# Patient Record
Sex: Male | Born: 1965 | Race: Black or African American | Hispanic: No | Marital: Single | Smoking: Never smoker
Health system: Southern US, Community
[De-identification: ages and names within clinical notes are randomized; demographics above are authoritative.]

## PROBLEM LIST (undated history)

## (undated) ENCOUNTER — Emergency Department: Payer: BC Managed Care – PPO

## (undated) DIAGNOSIS — E119 Type 2 diabetes mellitus without complications: Secondary | ICD-10-CM

## (undated) DIAGNOSIS — M5416 Radiculopathy, lumbar region: Secondary | ICD-10-CM

## (undated) DIAGNOSIS — J189 Pneumonia, unspecified organism: Secondary | ICD-10-CM

## (undated) DIAGNOSIS — I10 Essential (primary) hypertension: Secondary | ICD-10-CM

## (undated) HISTORY — PX: KNEE SURGERY: SHX244

---

## 2004-06-08 ENCOUNTER — Ambulatory Visit: Payer: Self-pay | Admitting: Internal Medicine

## 2004-06-14 ENCOUNTER — Ambulatory Visit: Payer: Self-pay | Admitting: Internal Medicine

## 2009-02-09 ENCOUNTER — Emergency Department: Payer: Self-pay | Admitting: Emergency Medicine

## 2016-10-14 ENCOUNTER — Other Ambulatory Visit: Payer: Self-pay | Admitting: Oncology

## 2017-11-11 ENCOUNTER — Encounter: Payer: Self-pay | Admitting: Emergency Medicine

## 2017-11-11 ENCOUNTER — Other Ambulatory Visit: Payer: Self-pay

## 2017-11-11 ENCOUNTER — Ambulatory Visit
Admission: EM | Admit: 2017-11-11 | Discharge: 2017-11-11 | Disposition: A | Discharge status: Home / Self Care | Payer: 59 | Attending: Family Medicine | Admitting: Family Medicine

## 2017-11-11 DIAGNOSIS — J069 Acute upper respiratory infection, unspecified: Secondary | ICD-10-CM

## 2017-11-11 DIAGNOSIS — B9789 Other viral agents as the cause of diseases classified elsewhere: Secondary | ICD-10-CM | POA: Diagnosis not present

## 2017-11-11 NOTE — ED Triage Notes (Signed)
Patient c/o sinus congestion and pressure, cough, and sneezing that started Saturday night.  Patient denies fevers.

## 2017-11-11 NOTE — ED Provider Notes (Signed)
[facility]    CSN: [id number] Arrival date & time: 11/11/17  1010     History   Chief Complaint Chief Complaint  Patient presents with  . Nasal Congestion  . Sinus Problem    HPI Alexander Deleon is a 52 y.o. male.   The history is provided by the patient.  Sinus Problem  Pertinent negatives include no headaches.  URI  Presenting symptoms: congestion and rhinorrhea   Presenting symptoms comment:  "scratchy throat" Severity:  Moderate Onset quality:  Sudden Duration:  1 day Timing:  Constant Progression:  Unchanged Chronicity:  New Relieved by:  None tried Ineffective treatments:  None tried Associated symptoms: no headaches, no myalgias, no sinus pain and no wheezing   Risk factors: sick contacts   Risk factors: not elderly, no chronic cardiac disease, no chronic kidney disease, no chronic respiratory disease, no diabetes mellitus, no immunosuppression, no recent illness and no recent travel     History reviewed. No pertinent past medical history.  There are no active problems to display for this patient.   Past Surgical History:  Procedure Laterality Date  . KNEE SURGERY Left        Home Medications    Prior to Admission medications   Not on File    Family History Family History  Problem Relation Age of Onset  . Hypertension Mother   . Hypertension Father     Social History Social History   Tobacco Use  . Smoking status: Never Smoker  . Smokeless tobacco: Never Used  Substance Use Topics  . Alcohol use: Yes  . Drug use: Never     Allergies   Patient has no known allergies.   Review of Systems Review of Systems  HENT: Positive for congestion and rhinorrhea. Negative for sinus pain.   Respiratory: Negative for wheezing.   Musculoskeletal: Negative for myalgias.  Neurological: Negative for headaches.     Physical Exam Triage Vital Signs ED Triage Vitals  Enc Vitals Group     BP 11/11/17 1035 (!) 133/97     Pulse  Rate 11/11/17 1035 85     Resp 11/11/17 1035 16     Temp 11/11/17 1035 98.7 F (37.1 C)     Temp Source 11/11/17 1035 Oral     SpO2 11/11/17 1035 99 %     Weight 11/11/17 1033 218 lb (98.9 kg)     Height 11/11/17 1033 6\' 1"  (1.854 m)     Head Circumference --      Peak Flow --      Pain Score 11/11/17 1033 0     Pain Loc --      Pain Edu? --      Excl. in GC? --    No data found.  Updated Vital Signs BP (!) 133/97 (BP Location: Left Arm)   Pulse 85   Temp 98.7 F (37.1 C) (Oral)   Resp 16   Ht 6\' 1"  (1.854 m)   Wt 98.9 kg   SpO2 99%   BMI 28.76 kg/m   Visual Acuity Right Eye Distance:   Left Eye Distance:   Bilateral Distance:    Right Eye Near:   Left Eye Near:    Bilateral Near:     Physical Exam  Constitutional: He appears well-developed and well-nourished. No distress.  HENT:  Head: Normocephalic and atraumatic.  Right Ear: Tympanic membrane, external ear and ear canal normal.  Left Ear: Tympanic membrane, external ear and ear canal normal.  Nose: Rhinorrhea present.  Mouth/Throat: Uvula is midline and mucous membranes are normal. Posterior oropharyngeal erythema present. No oropharyngeal exudate, posterior oropharyngeal edema or tonsillar abscesses. No tonsillar exudate.  Neck: No tracheal deviation present. No thyromegaly present.  Cardiovascular: Normal rate, regular rhythm and normal heart sounds.  Pulmonary/Chest: Effort normal and breath sounds normal. No stridor. No respiratory distress. He has no wheezes. He has no rales. He exhibits no tenderness.  Lymphadenopathy:    He has no cervical adenopathy.  Neurological: He is alert.  Skin: Skin is warm and dry. No rash noted. He is not diaphoretic.  Nursing note and vitals reviewed.    UC Treatments / Results  Labs (all labs ordered are listed, but only abnormal results are displayed) Labs Reviewed - No data to display  EKG None  Radiology No results found.  Procedures Procedures (including  critical care time)  Medications Ordered in UC Medications - No data to display  Initial Impression / Assessment and Plan / UC Course  I have reviewed the triage vital signs and the nursing notes.  Pertinent labs & imaging results that were available during my care of the patient were reviewed by me and considered in my medical decision making (see chart for details).      Final Clinical Impressions(s) / UC Diagnoses   Final diagnoses:  Viral URI with cough   Discharge Instructions   None    ED Prescriptions    None     1. diagnosis reviewed with patient 2. Recommend supportive treatment with rest, fluids, otc "cold" medication (note: patient appeared upset that I didn't prescribe anything, stating "I've been waiting her and your not going to give me anything", then walked out of room before conversation ended) 3. Follow-up prn if symptoms worsen or don't improve  Controlled Substance Prescriptions  Controlled Substance Registry consulted? Not Applicable   Payton Mccallum, MD 11/11/17 1128

## 2018-11-10 ENCOUNTER — Other Ambulatory Visit: Payer: Self-pay

## 2018-11-10 DIAGNOSIS — Z20822 Contact with and (suspected) exposure to covid-19: Secondary | ICD-10-CM

## 2018-11-11 ENCOUNTER — Telehealth: Payer: Self-pay | Admitting: General Practice

## 2018-11-11 LAB — NOVEL CORONAVIRUS, NAA: SARS-CoV-2, NAA: NOT DETECTED

## 2018-11-11 NOTE — Telephone Encounter (Signed)
Patient call and received his covid test results

## 2018-12-10 ENCOUNTER — Encounter: Payer: Self-pay | Admitting: Family Medicine

## 2019-01-12 ENCOUNTER — Other Ambulatory Visit: Payer: Self-pay

## 2019-01-12 DIAGNOSIS — Z20822 Contact with and (suspected) exposure to covid-19: Secondary | ICD-10-CM

## 2019-01-13 LAB — NOVEL CORONAVIRUS, NAA: SARS-CoV-2, NAA: NOT DETECTED

## 2019-01-19 ENCOUNTER — Ambulatory Visit (INDEPENDENT_AMBULATORY_CARE_PROVIDER_SITE_OTHER): Payer: 59 | Admitting: Gastroenterology

## 2019-01-19 ENCOUNTER — Other Ambulatory Visit: Payer: Self-pay

## 2019-01-19 VITALS — BP 142/88 | HR 89 | Temp 98.2°F | Ht 73.0 in | Wt 225.4 lb

## 2019-01-19 DIAGNOSIS — R159 Full incontinence of feces: Secondary | ICD-10-CM

## 2019-01-19 DIAGNOSIS — K5900 Constipation, unspecified: Secondary | ICD-10-CM | POA: Diagnosis not present

## 2019-01-19 NOTE — Progress Notes (Signed)
Alexander Mood MD, MRCP(U.K) [address]  [address]  [place], [place] [address]  Main: [phone]  Fax: [phone]   Gastroenterology Consultation  Referring Provider:     Preston Deleon* Primary Care Physician:  Alexander Fleeting, MD Primary Gastroenterologist:  Dr. Wyline Deleon  Reason for Consultation:     Fecal incontinence        HPI:   Alexander Deleon is a 53 y.o. y/o male referred for consultation & management  by Dr. Neita Deleon, Alexander Raddle, MD.   He states that since 2016 he has noticed that he has had fecal incontinence.  His undergarments are stained with a brown liquid.  He has a bowel movement every day but sometimes hard and sometimes very watery.  His diet is devoid of fiber.  Very rich in meat.  He had a colonoscopy in 2016 at [facility] which showed no polyps.  Repeat of suggested in 10 years.  No change in weight.  Some lower abdominal discomfort that improves after bowel movement.  Does not take any laxatives.   No past medical history on file.  Past Surgical History:  Procedure Laterality Date  . KNEE SURGERY Left     Prior to Admission medications   Not on File    Family History  Problem Relation Age of Onset  . Hypertension Mother   . Hypertension Father      Social History   Tobacco Use  . Smoking status: Never Smoker  . Smokeless tobacco: Never Used  Substance Use Topics  . Alcohol use: Yes  . Drug use: Never    Allergies as of 01/19/2019  . (No Known Allergies)    Review of Systems:    All systems reviewed and negative except where noted in HPI.   Physical Exam:  BP (!) 142/88   Pulse 89   Temp 98.2 F (36.8 C)   Ht 6\' 1"  (1.854 m)   Wt 225 lb 6.4 oz (102.2 kg)   BMI 29.74 kg/m  No LMP for male patient. Psych:  Alert and cooperative. Normal Deleon and affect. General:   Alert,  Well-developed, well-nourished, pleasant and cooperative in NAD Head:  Normocephalic and atraumatic. Eyes:  Sclera clear, no icterus.    Conjunctiva pink. Ears:  Normal auditory acuity. Nose:  No deformity, discharge, or lesions. Mouth:  No deformity or lesions,oropharynx pink & moist. Neck:  Supple; no masses or thyromegaly. Lungs:  Respirations even and unlabored.  Clear throughout to auscultation.   No wheezes, crackles, or rhonchi. No acute distress. Heart:  Regular rate and rhythm; no murmurs, clicks, rubs, or gallops. Abdomen:  Normal bowel sounds.  No bruits.  Soft, non-tender and non-distended without masses, hepatosplenomegaly or hernias noted.  No guarding or rebound tenderness.    Neurologic:  Alert and oriented x3;  grossly normal neurologically. Skin:  Intact without significant lesions or rashes. No jaundice. Lymph Nodes:  No significant cervical adenopathy. Psych:  Alert and cooperative. Normal Deleon and affect.  Imaging Studies: No results found.  Assessment and Plan:   Alexander Deleon is a 52 y.o. y/o male has been referred for fecal incontinence.  I suspect he may suffer from a degree of constipation leading to rectal impaction and possible overflow of liquid causing incontinence.  Other differentials would also include fistula from Crohn's disease and lesions in the lower GI tract.  His diet is very poor in fiber.  Plan 1.  High-fiber diet target of 25 to 30 g/day.  Provided patient information on the same.  Provided fiber pills samples.  If does not not work we will add MiraLAX or [place] 2.  I would suggest a colonoscopy to rule out fistula, lesions in the lower GI tract.  I will performed a detailed rectal exam on that day when he is more relaxed to rule out any sinus tracts or fistula a in the perianal area  I have discussed alternative options, risks & benefits,  which include, but are not limited to, bleeding, infection, perforation,respiratory complication & drug reaction.  The patient agrees with this plan & written consent will be obtained.     Follow up in 2 weeks telephone visit  Dr [name]  MD,MRCP(U.K)

## 2019-01-19 NOTE — Patient Instructions (Signed)

## 2019-01-23 ENCOUNTER — Other Ambulatory Visit: Payer: 59 | Attending: Gastroenterology

## 2019-01-23 ENCOUNTER — Telehealth: Payer: Self-pay

## 2019-01-23 NOTE — Telephone Encounter (Signed)
Called pt to remind him to go for his pre-procedure COVID test today. Pt states he wants to cancel his colonoscopy for now, he plans to contact our office when he's ready to reschedule. [facility] has been notified.

## 2019-01-28 ENCOUNTER — Encounter: Admission: RE | Payer: Self-pay | Source: Home / Self Care

## 2019-01-28 ENCOUNTER — Ambulatory Visit: Admission: RE | Admit: 2019-01-28 | Payer: 59 | Source: Home / Self Care | Admitting: Gastroenterology

## 2019-01-28 SURGERY — COLONOSCOPY WITH PROPOFOL
Anesthesia: General

## 2019-02-09 ENCOUNTER — Ambulatory Visit (INDEPENDENT_AMBULATORY_CARE_PROVIDER_SITE_OTHER): Payer: 59 | Admitting: Gastroenterology

## 2019-02-09 ENCOUNTER — Other Ambulatory Visit: Payer: Self-pay

## 2019-02-09 DIAGNOSIS — R159 Full incontinence of feces: Secondary | ICD-10-CM

## 2019-02-09 NOTE — Progress Notes (Signed)
    [name] , MD [address]  [place]  [place], [place] [address]  Main: [phone]  Fax: [phone]   Primary Care Physician: Theotis Burrow, MD  Virtual Visit via Telephone Note  I connected with patient on 02/09/19 at  1:30 PM EST by telephone and verified that I am speaking with the correct person using two identifiers.   I discussed the limitations, risks, security and privacy concerns of performing an evaluation and management service by telephone and the availability of in person appointments. I also discussed with the patient that there may be a patient responsible charge related to this service. The patient expressed understanding and agreed to proceed.  Location of Patient: Home Location of Provider: Home Persons involved: Patient and provider only   History of Present Illness:   Fecal incontinence follow-up  HPI: Alexander Deleon is a 53 y.o. male     Summary of history :  H/o fecal incontinence since 2016. His undergarments are stained with a brown liquid.  He has a bowel movement every day but sometimes hard and sometimes very watery.  His diet is devoid of fiber.  Very rich in meat.  He had a colonoscopy in 2016 at [facility] which showed no polyps.  Repeat of suggested in 10 years.  No change in weight.  Some lower abdominal discomfort that improves after bowel movement.  Does not take any laxatives.  Interval history   01/19/2019-02/09/2019  Says the issues with incontinence at the same but he does not want to schedule colonoscopy at this point of time.  I explained the importance of doing so.    No current outpatient medications on file.   No current facility-administered medications for this visit.     Allergies as of 02/09/2019  . (No Known Allergies)    Review of Systems:    All systems reviewed and negative except where noted in HPI.   Observations/Objective:  Labs: CMP  No results found for: NA, K, CL, CO2, GLUCOSE, BUN,  CREATININE, CALCIUM, PROT, ALBUMIN, AST, ALT, ALKPHOS, BILITOT, GFRNONAA, GFRAA No results found for: WBC, HGB, HCT, MCV, PLT  Imaging Studies: No results found.  Assessment and Plan:   Alexander Deleon is a 53 y.o. y/o male here to follow up for fecal incontinence.  I suspect he may suffer from a degree of constipation leading to rectal impaction and possible overflow of liquid causing incontinence.  Other differentials would also include fistula from Crohn's disease and lesions in the lower GI tract.    Since his last visit no significant increase in the fiber content of his food.  He canceled his colonoscopy that was scheduled.  He said he will call and schedule it when he decides to do so.  I said we will follow-up with him after his colonoscopy as to what the next steps would be for stage fecal incontinence.   I discussed the assessment and treatment plan with the patient. The patient was provided an opportunity to ask questions and all were answered. The patient agreed with the plan and demonstrated an understanding of the instructions.   The patient was advised to call back or seek an in-person evaluation if the symptoms worsen or if the condition fails to improve as anticipated.  I provided 8 minutes of non-face-to-face time during this encounter.  Dr [name] MD,MRCP [facility]) Gastroenterology/Hepatology Pager: [phone]   Speech recognition software was used to dictate this note.

## 2019-04-02 ENCOUNTER — Other Ambulatory Visit: Payer: Self-pay

## 2019-04-02 ENCOUNTER — Encounter: Payer: Self-pay | Admitting: Family Medicine

## 2019-04-02 ENCOUNTER — Ambulatory Visit: Payer: BC Managed Care – PPO | Admitting: Family Medicine

## 2019-04-02 DIAGNOSIS — Z113 Encounter for screening for infections with a predominantly sexual mode of transmission: Secondary | ICD-10-CM

## 2019-04-02 LAB — GRAM STAIN

## 2019-04-02 NOTE — Progress Notes (Signed)
In for STD screening; denies s/s; declines HIV/RPR testing Sharlette Dense, RN

## 2019-04-02 NOTE — Progress Notes (Signed)
  [facility] Department STI clinic/screening visit  Subjective:  Alexander Deleon is a 54 y.o. male being seen today for  Chief Complaint  Patient presents with  . SEXUALLY TRANSMITTED DISEASE     The patient reports they no have symptoms.   Patient has the following medical conditions:  There are no problems to display for this patient.   HPI  Pt reports he is here for STI testing. Denies symptoms.   See flowsheet for further details and programmatic requirements.    No components found for: HCV  The following portions of the patient's history were reviewed and updated as appropriate: allergies, current medications, past medical history, past social history, past surgical history and problem list.  Objective:  There were no vitals filed for this visit.   Physical Exam Constitutional:      Appearance: Normal appearance.  HENT:     Head: Normocephalic and atraumatic.     Comments: No nits or hair loss    Mouth/Throat:     Mouth: Mucous membranes are moist.     Pharynx: Oropharynx is clear. No oropharyngeal exudate or posterior oropharyngeal erythema.  Pulmonary:     Effort: Pulmonary effort is normal.  Abdominal:     General: Abdomen is flat.     Palpations: Abdomen is soft. There is no hepatomegaly or mass.     Tenderness: There is no abdominal tenderness.  Genitourinary:    Pubic Area: No rash or pubic lice.      Penis: Normal and circumcised.      Testes: Normal.     Epididymis:     Right: Normal.     Left: Normal.     Rectum: Normal.  Lymphadenopathy:     Head:     Right side of head: No preauricular or posterior auricular adenopathy.     Left side of head: No preauricular or posterior auricular adenopathy.     Cervical: No cervical adenopathy.     Upper Body:     Right upper body: No supraclavicular or axillary adenopathy.     Left upper body: No supraclavicular or axillary adenopathy.     Lower Body: No right inguinal adenopathy. No left  inguinal adenopathy.  Skin:    General: Skin is warm and dry.     Findings: No rash.  Neurological:     Mental Status: He is alert and oriented to person, place, and time.       Assessment and Plan:  Alexander Deleon is a 54 y.o. male presenting to the [facility] Department for STI screening    1. Screening examination for venereal disease -Screenings today as below. Treat gram stain per standing order. -Patient does meet criteria for HepC Screening, not Hep B. Declines this as well as HIV and syphilis screenings. -Counseled on warning s/sx and when to seek care. Recommended condom use with all sex and discussed importance of condom use for STI prevention. - Gram stain - Gonococcus culture   Return for screening as needed.  No future appointments.  Ann Held, PA-C

## 2019-04-07 LAB — GONOCOCCUS CULTURE

## 2019-04-14 ENCOUNTER — Other Ambulatory Visit: Payer: Self-pay

## 2019-04-14 DIAGNOSIS — R159 Full incontinence of feces: Secondary | ICD-10-CM

## 2019-04-30 ENCOUNTER — Other Ambulatory Visit: Payer: Self-pay

## 2019-04-30 ENCOUNTER — Other Ambulatory Visit
Admission: RE | Admit: 2019-04-30 | Discharge: 2019-04-30 | Disposition: A | Discharge status: Home / Self Care | Payer: BC Managed Care – PPO | Source: Ambulatory Visit | Attending: Gastroenterology | Admitting: Gastroenterology

## 2019-04-30 ENCOUNTER — Telehealth: Payer: Self-pay

## 2019-04-30 DIAGNOSIS — Z01812 Encounter for preprocedural laboratory examination: Secondary | ICD-10-CM | POA: Insufficient documentation

## 2019-04-30 DIAGNOSIS — Z20822 Contact with and (suspected) exposure to covid-19: Secondary | ICD-10-CM | POA: Insufficient documentation

## 2019-04-30 LAB — SARS CORONAVIRUS 2 (TAT 6-24 HRS): SARS Coronavirus 2: NEGATIVE

## 2019-04-30 NOTE — Telephone Encounter (Signed)
Patient has been informed his bowel prep has been changed from SuPrep to Greer.  Insurance does not cover [employer]. Patient has been instructed to begin Golytely Bowel prep the evening before his colonoscopy at 5pm.  Drink 8 oz every 30 minutes until he completes the entire contents.  Thanks,  [place], [place]

## 2019-05-01 ENCOUNTER — Encounter: Payer: Self-pay | Admitting: Gastroenterology

## 2019-05-04 ENCOUNTER — Ambulatory Visit: Payer: BC Managed Care – PPO | Admitting: Anesthesiology

## 2019-05-04 ENCOUNTER — Encounter
Admission: RE | Disposition: A | Discharge status: Home / Self Care | Payer: Self-pay | Source: Home / Self Care | Attending: Gastroenterology

## 2019-05-04 ENCOUNTER — Ambulatory Visit
Admission: RE | Admit: 2019-05-04 | Discharge: 2019-05-04 | Disposition: A | Discharge status: Home / Self Care | Payer: BC Managed Care – PPO | Attending: Gastroenterology | Admitting: Gastroenterology

## 2019-05-04 ENCOUNTER — Encounter: Payer: Self-pay | Admitting: Gastroenterology

## 2019-05-04 DIAGNOSIS — Z8249 Family history of ischemic heart disease and other diseases of the circulatory system: Secondary | ICD-10-CM | POA: Diagnosis not present

## 2019-05-04 DIAGNOSIS — K640 First degree hemorrhoids: Secondary | ICD-10-CM | POA: Diagnosis not present

## 2019-05-04 DIAGNOSIS — R159 Full incontinence of feces: Secondary | ICD-10-CM | POA: Diagnosis present

## 2019-05-04 HISTORY — PX: COLONOSCOPY WITH PROPOFOL: SHX5780

## 2019-05-04 SURGERY — COLONOSCOPY WITH PROPOFOL
Anesthesia: General

## 2019-05-04 MED ORDER — PROPOFOL 500 MG/50ML IV EMUL
INTRAVENOUS | Status: DC | PRN
  Administered 2019-05-04: 140 ug/kg/min via INTRAVENOUS

## 2019-05-04 MED ORDER — SODIUM CHLORIDE 0.9 % IV SOLN: INTRAVENOUS | Status: DC

## 2019-05-04 MED ORDER — PROPOFOL 10 MG/ML IV BOLUS
INTRAVENOUS | Status: DC | PRN
  Administered 2019-05-04: 30 mg via INTRAVENOUS
  Administered 2019-05-04: 70 mg via INTRAVENOUS
  Administered 2019-05-04: 40 mg via INTRAVENOUS

## 2019-05-04 NOTE — Transfer of Care (Signed)
Immediate Anesthesia Transfer of Care Note  Patient: Alexander Deleon  Procedure(s) Performed: COLONOSCOPY WITH PROPOFOL (N/A )  Patient Location: PACU  Anesthesia Type:General  Level of Consciousness: sedated  Airway & Oxygen Therapy: Patient Spontanous Breathing and Patient connected to nasal cannula oxygen  Post-op Assessment: Report given to RN and Post -op Vital signs reviewed and stable  Post vital signs: Reviewed and stable  Last Vitals:  Vitals Value Taken Time  BP 120/85 05/04/19 1041  Temp    Pulse 69 05/04/19 1040  Resp 13 05/04/19 1041  SpO2 99 % 05/04/19 1040  Vitals shown include unvalidated device data.  Last Pain:  Vitals:   05/04/19 1010  TempSrc: Temporal  PainSc:          Complications: No apparent anesthesia complications

## 2019-05-04 NOTE — Op Note (Signed)
[facility] Gastroenterology Patient Name: Alexander Deleon Procedure Date: 05/04/2019 9:56 AM MRN: [id number] Account #: [id number] Date of Birth: 08-Nov-1965 Admit Type: Outpatient Age: 54 Room: [facility] ENDO ROOM 4 Gender: Male Note Status: Finalized Procedure:             Colonoscopy Indications:           Fecal incontinence Providers:             [name] MD, MD Referring MD:          Theotis Burrow (Referring MD) Medicines:             Monitored Anesthesia Care Complications:         No immediate complications. Procedure:             Pre-Anesthesia Assessment:                        - Prior to the procedure, a History and Physical was                         performed, and patient medications, allergies and                         sensitivities were reviewed. The patient's tolerance                         of previous anesthesia was reviewed.                        - The risks and benefits of the procedure and the                         sedation options and risks were discussed with the                         patient. All questions were answered and informed                         consent was obtained.                        - ASA Grade Assessment: II - A patient with mild                         systemic disease.                        After obtaining informed consent, the colonoscope was                         passed under direct vision. Throughout the procedure,                         the patient's blood pressure, pulse, and oxygen                         saturations were monitored continuously. The                         Colonoscope was introduced through the anus and  advanced to the the cecum, identified by the                         appendiceal orifice. The colonoscopy was performed                         with ease. The patient tolerated the procedure well.                         The quality of the bowel preparation  was good. Findings:      The perianal and digital rectal examinations were normal.      Non-bleeding internal hemorrhoids were found during retroflexion. The       hemorrhoids were large and Grade I (internal hemorrhoids that do not       prolapse).      The exam was otherwise without abnormality on direct and retroflexion       views. Impression:            - Non-bleeding internal hemorrhoids.                        - The examination was otherwise normal on direct and                         retroflexion views.                        - No specimens collected. Recommendation:        - Discharge patient to home (with escort).                        - Resume previous diet.                        - Continue present medications.                        - Repeat colonoscopy in 10 years for screening                         purposes.                        - Return to my office in 4 weeks. Procedure Code(s):     --- Professional ---                        [phone], Colonoscopy, flexible; diagnostic, including                         collection of specimen(s) by brushing or washing, when                         performed (separate procedure) Diagnosis Code(s):     --- Professional ---                        K64.0, First degree hemorrhoids                        R15.9, Full incontinence of feces CPT copyright 2019 American Medical Association. All  rights reserved. The codes documented in this report are preliminary and upon coder review may  be revised to meet current compliance requirements. Wyline Mood, MD Wyline Mood MD, MD 05/04/2019 10:13:40 AM This report has been signed electronically. Number of Addenda: 0 Note Initiated On: 05/04/2019 9:56 AM Scope Withdrawal Time: 0 hours 9 minutes 5 seconds  Total Procedure Duration: 0 hours 10 minutes 54 seconds  Estimated Blood Loss:  Estimated blood loss: none.      [facility]

## 2019-05-04 NOTE — Anesthesia Preprocedure Evaluation (Signed)
Anesthesia Evaluation  Patient identified by MRN, date of birth, ID band Patient awake    Reviewed: Allergy & Precautions, H&P , NPO status , Patient's Chart, lab work & pertinent test results, reviewed documented beta blocker date and time   Airway Mallampati: II   Neck ROM: full    Dental  (+) Poor Dentition   Pulmonary neg pulmonary ROS,    Pulmonary exam normal        Cardiovascular negative cardio ROS Normal cardiovascular exam Rhythm:regular Rate:Normal     Neuro/Psych negative neurological ROS  negative psych ROS   GI/Hepatic negative GI ROS, Neg liver ROS,   Endo/Other  negative endocrine ROS  Renal/GU negative Renal ROS  negative genitourinary   Musculoskeletal   Abdominal   Peds  Hematology negative hematology ROS (+)   Anesthesia Other Findings History reviewed. No pertinent past medical history. Past Surgical History: No date: KNEE SURGERY; Left BMI    Body Mass Index: 28.76 kg/m     Reproductive/Obstetrics negative OB ROS                             Anesthesia Physical Anesthesia Plan  ASA: II  Anesthesia Plan: General   Post-op Pain Management:    Induction:   PONV Risk Score and Plan:   Airway Management Planned:   Additional Equipment:   Intra-op Plan:   Post-operative Plan:   Informed Consent: I have reviewed the patients History and Physical, chart, labs and discussed the procedure including the risks, benefits and alternatives for the proposed anesthesia with the patient or authorized representative who has indicated his/her understanding and acceptance.     Dental Advisory Given  Plan Discussed with: CRNA  Anesthesia Plan Comments:         Anesthesia Quick Evaluation

## 2019-05-04 NOTE — H&P (Signed)
[name], MD [address] [place], [place], [place], [address] [address], [place], [place], [place], [address] Phone: [phone]  Fax: [phone]  Primary Care Physician:  Theotis Burrow, MD   Pre-Procedure History & Physical: HPI:  Alexander Deleon is a 54 y.o. male is here for an colonoscopy.   History reviewed. No pertinent past medical history.  Past Surgical History:  Procedure Laterality Date  . KNEE SURGERY Left     Prior to Admission medications   Not on File    Allergies as of 04/15/2019  . (No Known Allergies)    Family History  Problem Relation Age of Onset  . Hypertension Mother   . Hypertension Father     Social History   Socioeconomic History  . Marital status: Single    Spouse name: Not on file  . Number of children: Not on file  . Years of education: Not on file  . Highest education level: Not on file  Occupational History  . Not on file  Tobacco Use  . Smoking status: Never Smoker  . Smokeless tobacco: Never Used  Substance and Sexual Activity  . Alcohol use: Yes    Alcohol/week: 25.0 standard drinks    Types: 20 Cans of beer, 5 Shots of liquor per week  . Drug use: Never  . Sexual activity: Yes    Partners: Female  Other Topics Concern  . Not on file  Social History Narrative  . Not on file   Social Determinants of Health   Financial Resource Strain:   . Difficulty of Paying Living Expenses: Not on file  Food Insecurity:   . Worried About Charity fundraiser in the Last Year: Not on file  . Ran Out of Food in the Last Year: Not on file  Transportation Needs:   . Lack of Transportation (Medical): Not on file  . Lack of Transportation (Non-Medical): Not on file  Physical Activity:   . Days of Exercise per Week: Not on file  . Minutes of Exercise per Session: Not on file  Stress:   . Feeling of Stress : Not on file  Social Connections:   . Frequency of Communication with Friends and Family: Not on  file  . Frequency of Social Gatherings with Friends and Family: Not on file  . Attends Religious Services: Not on file  . Active Member of Clubs or Organizations: Not on file  . Attends Archivist Meetings: Not on file  . Marital Status: Not on file  Intimate Partner Violence:   . Fear of Current or Ex-Partner: Not on file  . Emotionally Abused: Not on file  . Physically Abused: Not on file  . Sexually Abused: Not on file    Review of Systems: See HPI, otherwise negative ROS  Physical Exam: BP 122/90   Pulse 76   Temp (!) 97.4 F (36.3 C) (Temporal)   Resp 16   Ht 6\' 1"  (1.854 m)   Wt 98.9 kg   SpO2 100%   BMI 28.76 kg/m  General:   Alert,  pleasant and cooperative in NAD Head:  Normocephalic and atraumatic. Neck:  Supple; no masses or thyromegaly. Lungs:  Clear throughout to auscultation, normal respiratory effort.    Heart:  +S1, +S2, Regular rate and rhythm, No edema. Abdomen:  Soft, nontender and nondistended. Normal bowel sounds, without guarding, and without rebound.   Neurologic:  Alert and  oriented x4;  grossly normal neurologically.  Impression/Plan: Alexander Deleon is here for an colonoscopy to be performed for fecal incontiennce. Risks, benefits, limitations, and alternatives regarding  colonoscopy have been reviewed with the patient.  Questions have been answered.  All parties agreeable.   Wyline Mood, MD  05/04/2019, 9:55 AM

## 2019-05-05 ENCOUNTER — Encounter: Payer: Self-pay | Admitting: *Deleted

## 2019-05-05 NOTE — Anesthesia Postprocedure Evaluation (Signed)
Anesthesia Post Note  Patient: Alexander Deleon  Procedure(s) Performed: COLONOSCOPY WITH PROPOFOL (N/A )  Patient location during evaluation: PACU Anesthesia Type: General Level of consciousness: awake and alert Pain management: pain level controlled Vital Signs Assessment: post-procedure vital signs reviewed and stable Respiratory status: spontaneous breathing, nonlabored ventilation, respiratory function stable and patient connected to nasal cannula oxygen Cardiovascular status: blood pressure returned to baseline and stable Postop Assessment: no apparent nausea or vomiting Anesthetic complications: no     Last Vitals:  Vitals:   05/04/19 1030 05/04/19 1040  BP: 114/68 120/85  Pulse: 73 73  Resp: 14 18  Temp:    SpO2: 99% 100%    Last Pain:  Vitals:   05/04/19 1010  TempSrc: Temporal  PainSc:                  Yevette Edwards

## 2019-07-22 ENCOUNTER — Emergency Department: Payer: BC Managed Care – PPO

## 2019-07-22 ENCOUNTER — Inpatient Hospital Stay
Admission: EM | Admit: 2019-07-22 | Discharge: 2019-07-27 | DRG: 177 | Disposition: A | Discharge status: Home / Self Care | Payer: BC Managed Care – PPO | Attending: Internal Medicine | Admitting: Internal Medicine

## 2019-07-22 ENCOUNTER — Other Ambulatory Visit: Payer: Self-pay

## 2019-07-22 ENCOUNTER — Encounter: Payer: Self-pay | Admitting: Internal Medicine

## 2019-07-22 DIAGNOSIS — Z8249 Family history of ischemic heart disease and other diseases of the circulatory system: Secondary | ICD-10-CM

## 2019-07-22 DIAGNOSIS — R197 Diarrhea, unspecified: Secondary | ICD-10-CM | POA: Diagnosis not present

## 2019-07-22 DIAGNOSIS — J1282 Pneumonia due to coronavirus disease 2019: Secondary | ICD-10-CM | POA: Diagnosis present

## 2019-07-22 DIAGNOSIS — E118 Type 2 diabetes mellitus with unspecified complications: Secondary | ICD-10-CM | POA: Diagnosis not present

## 2019-07-22 DIAGNOSIS — Z7289 Other problems related to lifestyle: Secondary | ICD-10-CM | POA: Diagnosis not present

## 2019-07-22 DIAGNOSIS — E1165 Type 2 diabetes mellitus with hyperglycemia: Secondary | ICD-10-CM | POA: Diagnosis not present

## 2019-07-22 DIAGNOSIS — Z789 Other specified health status: Secondary | ICD-10-CM

## 2019-07-22 DIAGNOSIS — J9601 Acute respiratory failure with hypoxia: Secondary | ICD-10-CM | POA: Diagnosis present

## 2019-07-22 DIAGNOSIS — U071 COVID-19: Secondary | ICD-10-CM | POA: Diagnosis present

## 2019-07-22 DIAGNOSIS — F1090 Alcohol use, unspecified, uncomplicated: Secondary | ICD-10-CM

## 2019-07-22 DIAGNOSIS — D72819 Decreased white blood cell count, unspecified: Secondary | ICD-10-CM | POA: Diagnosis present

## 2019-07-22 LAB — BASIC METABOLIC PANEL
Anion gap: 9 (ref 5–15)
BUN: 15 mg/dL (ref 6–20)
CO2: 23 mmol/L (ref 22–32)
Calcium: 9 mg/dL (ref 8.9–10.3)
Chloride: 99 mmol/L (ref 98–111)
Creatinine, Ser: 1.2 mg/dL (ref 0.61–1.24)
GFR calc Af Amer: >60 mL/min (ref >60)
GFR calc non Af Amer: >60 mL/min (ref >60)
Glucose, Bld: 190 mg/dL — ABNORMAL HIGH (ref 70–99)
Potassium: 3.7 mmol/L (ref 3.5–5.1)
Sodium: 131 mmol/L — ABNORMAL LOW (ref 135–145)

## 2019-07-22 LAB — CBC WITH DIFFERENTIAL/PLATELET
Abs Immature Granulocytes: 0.01 10*3/uL (ref 0.00–0.07)
Basophils Absolute: 0 10*3/uL (ref 0.0–0.1)
Basophils Relative: 0 %
Eosinophils Absolute: 0 10*3/uL (ref 0.0–0.5)
Eosinophils Relative: 1 %
HCT: 44.2 % (ref 39.0–52.0)
Hemoglobin: 14.6 g/dL (ref 13.0–17.0)
Immature Granulocytes: 0 %
Lymphocytes Relative: 22 %
Lymphs Abs: 1 10*3/uL (ref 0.7–4.0)
MCH: 27.5 pg (ref 26.0–34.0)
MCHC: 33 g/dL (ref 30.0–36.0)
MCV: 83.2 fL (ref 80.0–100.0)
Monocytes Absolute: 0.4 10*3/uL (ref 0.1–1.0)
Monocytes Relative: 8 %
Neutro Abs: 3.2 10*3/uL (ref 1.7–7.7)
Neutrophils Relative %: 69 %
Platelets: 199 10*3/uL (ref 150–400)
RBC: 5.31 MIL/uL (ref 4.22–5.81)
RDW: 13.6 % (ref 11.5–15.5)
WBC: 4.7 10*3/uL (ref 4.0–10.5)
nRBC: 0 % (ref 0.0–0.2)

## 2019-07-22 LAB — HIV ANTIBODY (ROUTINE TESTING W REFLEX): HIV Screen 4th Generation wRfx: NONREACTIVE

## 2019-07-22 LAB — FIBRIN DERIVATIVES D-DIMER (ARMC ONLY): Fibrin derivatives D-dimer (ARMC): 571.19 ng/mL (FEU) — ABNORMAL HIGH (ref 0.00–499.00)

## 2019-07-22 LAB — FIBRINOGEN: Fibrinogen: 680 mg/dL — ABNORMAL HIGH (ref 210–475)

## 2019-07-22 LAB — HEPATIC FUNCTION PANEL
ALT: 45 U/L — ABNORMAL HIGH (ref 0–44)
AST: 51 U/L — ABNORMAL HIGH (ref 15–41)
Albumin: 3.4 g/dL — ABNORMAL LOW (ref 3.5–5.0)
Alkaline Phosphatase: 50 U/L (ref 38–126)
Bilirubin, Direct: 0.2 mg/dL (ref 0.0–0.2)
Indirect Bilirubin: 0.5 mg/dL (ref 0.3–0.9)
Total Bilirubin: 0.7 mg/dL (ref 0.3–1.2)
Total Protein: 7.6 g/dL (ref 6.5–8.1)

## 2019-07-22 LAB — C-REACTIVE PROTEIN: CRP: 15 mg/dL — ABNORMAL HIGH (ref <1.0)

## 2019-07-22 LAB — PROCALCITONIN: Procalcitonin: 0.1 ng/mL

## 2019-07-22 LAB — TRIGLYCERIDES: Triglycerides: 163 mg/dL — ABNORMAL HIGH (ref <150)

## 2019-07-22 LAB — FERRITIN: Ferritin: 507 ng/mL — ABNORMAL HIGH (ref 24–336)

## 2019-07-22 LAB — LACTATE DEHYDROGENASE: LDH: 329 U/L — ABNORMAL HIGH (ref 98–192)

## 2019-07-22 LAB — TROPONIN I (HIGH SENSITIVITY): Troponin I (High Sensitivity): 5 ng/L (ref <18)

## 2019-07-22 LAB — HEPATITIS B SURFACE ANTIGEN: Hepatitis B Surface Ag: NONREACTIVE

## 2019-07-22 LAB — BRAIN NATRIURETIC PEPTIDE: B Natriuretic Peptide: 85 pg/mL (ref 0.0–100.0)

## 2019-07-22 MED ORDER — THIAMINE HCL 100 MG/ML IJ SOLN
100.0000 mg | Freq: Every day | INTRAMUSCULAR | Status: DC
  Filled 2019-07-22: qty 2

## 2019-07-22 MED ORDER — METHYLPREDNISOLONE SODIUM SUCC 125 MG IJ SOLR
60.0000 mg | Freq: Two times a day (BID) | INTRAMUSCULAR | Status: DC
  Administered 2019-07-22 – 2019-07-27 (×10): 60 mg via INTRAVENOUS
  Filled 2019-07-22 (×10): qty 2

## 2019-07-22 MED ORDER — IPRATROPIUM BROMIDE HFA 17 MCG/ACT IN AERS
2.0000 | INHALATION_SPRAY | RESPIRATORY_TRACT | Status: DC
  Administered 2019-07-22 – 2019-07-27 (×30): 2 via RESPIRATORY_TRACT
  Filled 2019-07-22: qty 12.9

## 2019-07-22 MED ORDER — LOPERAMIDE HCL 2 MG PO CAPS: 2.0000 mg | ORAL_CAPSULE | Freq: Every day | ORAL | Status: DC | PRN

## 2019-07-22 MED ORDER — FOLIC ACID 1 MG PO TABS
1.0000 mg | ORAL_TABLET | Freq: Every day | ORAL | Status: DC
  Administered 2019-07-22 – 2019-07-27 (×6): 1 mg via ORAL
  Filled 2019-07-22 (×6): qty 1

## 2019-07-22 MED ORDER — ALBUTEROL SULFATE HFA 108 (90 BASE) MCG/ACT IN AERS
2.0000 | INHALATION_SPRAY | RESPIRATORY_TRACT | Status: DC | PRN
  Administered 2019-07-23: 20:00:00 2 via RESPIRATORY_TRACT
  Filled 2019-07-22: qty 6.7

## 2019-07-22 MED ORDER — SODIUM CHLORIDE 0.9 % IV SOLN
200.0000 mg | Freq: Once | INTRAVENOUS | Status: AC
  Administered 2019-07-22: 200 mg via INTRAVENOUS
  Filled 2019-07-22: qty 40

## 2019-07-22 MED ORDER — DM-GUAIFENESIN ER 30-600 MG PO TB12
1.0000 | ORAL_TABLET | Freq: Two times a day (BID) | ORAL | Status: DC
  Administered 2019-07-22 – 2019-07-27 (×11): 1 via ORAL
  Filled 2019-07-22 (×11): qty 1

## 2019-07-22 MED ORDER — ZINC SULFATE 220 (50 ZN) MG PO CAPS
220.0000 mg | ORAL_CAPSULE | Freq: Every day | ORAL | Status: DC
  Administered 2019-07-22 – 2019-07-27 (×6): 220 mg via ORAL
  Filled 2019-07-22 (×6): qty 1

## 2019-07-22 MED ORDER — SODIUM CHLORIDE 0.9 % IV SOLN
100.0000 mg | Freq: Every day | INTRAVENOUS | Status: AC
  Administered 2019-07-23 – 2019-07-26 (×4): 100 mg via INTRAVENOUS
  Filled 2019-07-22 (×4): qty 100

## 2019-07-22 MED ORDER — ACETAMINOPHEN 500 MG PO TABS
1000.0000 mg | ORAL_TABLET | Freq: Once | ORAL | Status: AC
  Administered 2019-07-22: 09:00:00 1000 mg via ORAL
  Filled 2019-07-22: qty 2

## 2019-07-22 MED ORDER — ADULT MULTIVITAMIN W/MINERALS CH
1.0000 | ORAL_TABLET | Freq: Every day | ORAL | Status: DC
  Administered 2019-07-22 – 2019-07-27 (×6): 1 via ORAL
  Filled 2019-07-22 (×6): qty 1

## 2019-07-22 MED ORDER — ONDANSETRON HCL 4 MG/2ML IJ SOLN: 4.0000 mg | Freq: Three times a day (TID) | INTRAMUSCULAR | Status: DC | PRN

## 2019-07-22 MED ORDER — ASCORBIC ACID 500 MG PO TABS
500.0000 mg | ORAL_TABLET | Freq: Every day | ORAL | Status: DC
  Administered 2019-07-22 – 2019-07-27 (×6): 500 mg via ORAL
  Filled 2019-07-22 (×7): qty 1

## 2019-07-22 MED ORDER — LORAZEPAM 2 MG/ML IJ SOLN: 0.0000 mg | Freq: Four times a day (QID) | INTRAMUSCULAR | Status: AC

## 2019-07-22 MED ORDER — ONDANSETRON HCL 4 MG/2ML IJ SOLN
4.0000 mg | Freq: Once | INTRAMUSCULAR | Status: AC
  Administered 2019-07-22: 09:00:00 4 mg via INTRAVENOUS
  Filled 2019-07-22: qty 2

## 2019-07-22 MED ORDER — THIAMINE HCL 100 MG PO TABS
100.0000 mg | ORAL_TABLET | Freq: Every day | ORAL | Status: DC
  Administered 2019-07-22 – 2019-07-27 (×6): 100 mg via ORAL
  Filled 2019-07-22 (×6): qty 1

## 2019-07-22 MED ORDER — TOCILIZUMAB 400 MG/20ML IV SOLN
8.0000 mg/kg | Freq: Once | INTRAVENOUS | Status: AC
  Administered 2019-07-22: 798 mg via INTRAVENOUS
  Filled 2019-07-22: qty 39.9

## 2019-07-22 MED ORDER — LORAZEPAM 2 MG/ML IJ SOLN: 1.0000 mg | INTRAMUSCULAR | Status: AC | PRN

## 2019-07-22 MED ORDER — ACETAMINOPHEN 325 MG PO TABS: 650.0000 mg | ORAL_TABLET | Freq: Four times a day (QID) | ORAL | Status: DC | PRN

## 2019-07-22 MED ORDER — ENOXAPARIN SODIUM 40 MG/0.4ML ~~LOC~~ SOLN
40.0000 mg | SUBCUTANEOUS | Status: DC
  Administered 2019-07-22 – 2019-07-26 (×5): 40 mg via SUBCUTANEOUS
  Filled 2019-07-22 (×5): qty 0.4

## 2019-07-22 MED ORDER — DEXAMETHASONE SODIUM PHOSPHATE 10 MG/ML IJ SOLN
10.0000 mg | Freq: Once | INTRAMUSCULAR | Status: AC
  Administered 2019-07-22: 10 mg via INTRAVENOUS
  Filled 2019-07-22: qty 1

## 2019-07-22 MED ORDER — LORAZEPAM 1 MG PO TABS: 1.0000 mg | ORAL_TABLET | ORAL | Status: AC | PRN

## 2019-07-22 MED ORDER — LACTATED RINGERS IV BOLUS
1000.0000 mL | Freq: Once | INTRAVENOUS | Status: AC
  Administered 2019-07-22: 1000 mL via INTRAVENOUS

## 2019-07-22 MED ORDER — LORAZEPAM 2 MG/ML IJ SOLN: 0.0000 mg | Freq: Two times a day (BID) | INTRAMUSCULAR | Status: AC

## 2019-07-22 MED ORDER — ALBUTEROL SULFATE HFA 108 (90 BASE) MCG/ACT IN AERS
2.0000 | INHALATION_SPRAY | Freq: Once | RESPIRATORY_TRACT | Status: AC
  Administered 2019-07-22: 2 via RESPIRATORY_TRACT
  Filled 2019-07-22: qty 6.7

## 2019-07-22 NOTE — ED Provider Notes (Signed)
[facility] Emergency Department Provider Note   ____________________________________________   First MD Initiated Contact with Patient 07/22/19 [phone]     (approximate)  I have reviewed the triage vital signs and the nursing notes.   HISTORY  Chief Complaint Shortness of breath   HPI Alexander Deleon is a 54 y.o. male with no significant past medical history who presents to the ED complaining of shortness of breath.  Patient reports that he initially started feeling malaised with cough and shortness of breath about 1 week ago.  He subsequently tested positive for [facility] at [facility] on [facility].  He states his breathing has continued to worsen since then and he has also been feeling nauseous with very poor appetite.  He states he has not had anything to eat for the past 3 days and endorses some diarrhea, but has not vomited.  He denies any fevers or pain in his chest, has not been taking any medications for this at home other than Tylenol.        No past medical history on file.  Patient Active Problem List   Diagnosis Date Noted  . Pneumonia due to COVID-19 virus 07/22/2019  . Acute respiratory failure with hypoxia ([place]) 07/22/2019  . Alcohol use 07/22/2019    Past Surgical History:  Procedure Laterality Date  . COLONOSCOPY WITH PROPOFOL N/A 05/04/2019   Procedure: COLONOSCOPY WITH PROPOFOL;  Surgeon: [name], MD;  Location: [facility] ENDOSCOPY;  Service: Gastroenterology;  Laterality: N/A;  . KNEE SURGERY Left     Prior to Admission medications   Not on File    Allergies Patient has no known allergies.  Family History  Problem Relation Age of Onset  . Hypertension Mother   . Hypertension Father     Social History Social History   Tobacco Use  . Smoking status: Never Smoker  . Smokeless tobacco: Never Used  Substance Use Topics  . Alcohol use: Yes    Alcohol/week: 25.0 standard drinks    Types: 20 Cans of beer, 5 Shots of liquor per week   . Drug use: Never    Review of Systems  Constitutional: No fever/chills Eyes: No visual changes. ENT: No sore throat. Cardiovascular: Denies chest pain. Respiratory: Positive for cough and shortness of breath. Gastrointestinal: No abdominal pain.  Positive for nausea, no vomiting.  Positive for diarrhea.  No constipation. Genitourinary: Negative for dysuria. Musculoskeletal: Negative for back pain. Skin: Negative for rash. Neurological: Negative for headaches, focal weakness or numbness.  ____________________________________________   PHYSICAL EXAM:  VITAL SIGNS: ED Triage Vitals  Enc Vitals Group     BP      Pulse      Resp      Temp      Temp src      SpO2      Weight      Height      Head Circumference      Peak Flow      Pain Score      Pain Loc      Pain Edu?      Excl. in [place]?     Constitutional: Alert and oriented. Eyes: Conjunctivae are normal. Head: Atraumatic. Nose: No congestion/rhinnorhea. Mouth/Throat: Mucous membranes are moist. Neck: Normal ROM Cardiovascular: Normal rate, regular rhythm. Grossly normal heart sounds. Respiratory: Mild tachypnea with normal respiratory effort.  No retractions. Lungs CTAB. Gastrointestinal: Soft and nontender. No distention. Genitourinary: deferred Musculoskeletal: No lower extremity tenderness nor edema. Neurologic:  Normal speech and language. No gross focal neurologic deficits are appreciated. Skin:  Skin is warm, dry and intact. No rash noted. Psychiatric: Mood and affect are normal. Speech and behavior are normal.  ____________________________________________   LABS (all labs ordered are listed, but only abnormal results are displayed)  Labs Reviewed  BASIC METABOLIC PANEL - Abnormal; Notable for the following components:      Result Value   Sodium 131 (*)    Glucose, Bld 190 (*)    All other components within normal limits  CBC WITH DIFFERENTIAL/PLATELET  C-REACTIVE PROTEIN  FIBRIN DERIVATIVES  D-DIMER ([facility] ONLY)  FERRITIN  TROPONIN I (HIGH SENSITIVITY)   ____________________________________________  EKG  ED ECG REPORT I, Chesley Noon, the attending physician, personally viewed and interpreted this ECG.   Date: 07/22/2019  EKG Time: 8:28  Rate: 103  Rhythm: sinus tachycardia  Axis: LAD  Intervals:none  ST&T Change: T wave inversions in inferior leads   PROCEDURES  Procedure(s) performed (including Critical Care):  .Critical Care Performed by: Chesley Noon, MD Authorized by: Chesley Noon, MD   Critical care provider statement:    Critical care time (minutes):  45   Critical care time was exclusive of:  Separately billable procedures and treating other patients and teaching time   Critical care was necessary to treat or prevent imminent or life-threatening deterioration of the following conditions:  Respiratory failure   Critical care was time spent personally by me on the following activities:  Discussions with consultants, evaluation of patient's response to treatment, examination of patient, ordering and performing treatments and interventions, ordering and review of laboratory studies, ordering and review of radiographic studies, pulse oximetry, re-evaluation of patient's condition, obtaining history from patient or surrogate and review of old charts   I assumed direction of critical care for this patient from another provider in my specialty: no       ____________________________________________   INITIAL IMPRESSION / ASSESSMENT AND PLAN / ED COURSE       54 year old male with no significant past medical history presents to the ED with increasing shortness of breath along with nausea and poor appetite since testing positive for COVID-19 on April 29.  He is not in any respiratory distress and per EMS has been maintaining O2 sats around 93% on room air.  We will check EKG, chest x-ray, and labs.  Also plan to treat symptomatically with albuterol and  Zofran, hydrate with IV fluids.  Patient now noted to become hypoxic on room air, reaching 88% without any exertion.  He was placed on 3 L nasal cannula with improvement, chest x-ray also shows changes concerning for COVID-19 pneumonia.  He is febrile but I doubt bacterial infection or sepsis and we will hold off on antibiotics.  Plan to discuss with hospitalist for admission.      ____________________________________________   FINAL CLINICAL IMPRESSION(S) / ED DIAGNOSES  Final diagnoses:  Pneumonia due to COVID-19 virus  Acute respiratory failure with hypoxia [facility])     ED Discharge Orders    None       Note:  This document was prepared using Dragon voice recognition software and may include unintentional dictation errors.   Chesley Noon, MD 07/22/19 [phone]

## 2019-07-22 NOTE — H&P (Signed)
History and Physical    Alexander Deleon:[id number] DOB: 24-Dec-1965 DOA: 07/22/2019  Referring MD/NP/PA:   PCP: Theotis Burrow, MD   Patient coming from:  The patient is coming from home.  At baseline, pt is independent for most of ADL.        Chief Complaint: SOB  HPI: Alexander Deleon is a 54 y.o. male with without significant past medical history except for alcohol use, who presents with shortness of breath.   Patient has been having shortness breath for about 1 week, which has been progressively worsening. He also has cough, generalized weakness and malaise. He was [facility] ( he has positive Covid test document in his cell phone per EDP). He has poor appetite and decreased oral intake. He has had one diarrhea toady.  No nausea or vomiting.  He has minimal lower abdominal pain.  Patient does not have chest pain, symptoms of UTI or unilateral weakness. Patient has fever of 101.1 in ED. He was found to have oxygen desaturation to 88% on room air, which improved to 95% on 3 L nasal cannula oxygen in ED.  ED Course: pt was found to have WBC 4.7, trop 5, GFR >60, temperature 101.1, blood pressure 130/92, tachycardia.  Chest x-ray showed bilateral multifocal infiltration. Pt is admitted to [place] bed as inpatient.  Review of Systems:   General: has fevers, no chills, no body weight gain, has poor appetite, has fatigue HEENT: no blurry vision, hearing changes or sore throat Respiratory: has dyspnea, coughing, no wheezing CV: no chest pain, no palpitations GI: no nausea, vomiting, has abdominal pain, diarrhea, no constipation GU: no dysuria, burning on urination, increased urinary frequency, hematuria  Ext: no leg edema Neuro: no unilateral weakness, numbness, or tingling, no vision change or hearing loss Skin: no rash, no skin tear. MSK: No muscle spasm, no deformity, no limitation of range of movement in spin Heme: No easy bruising.  Travel  history: No recent long distant travel.  Allergy: No Known Allergies  No past medical history on file.  Past Surgical History:  Procedure Laterality Date  . COLONOSCOPY WITH PROPOFOL N/A 05/04/2019   Procedure: COLONOSCOPY WITH PROPOFOL;  Surgeon: [name], MD;  Location[facility] ENDOSCOPY;  Service: Gastroenterology;  Laterality: N/A;  . KNEE SURGERY Left     Social History:  reports that he has never smoked. He has never used smokeless tobacco. He reports current alcohol use of about 25.0 standard drinks of alcohol per week. He reports that he does not use drugs.  Family History:  Family History  Problem Relation Age of Onset  . Hypertension Mother   . Hypertension Father      Prior to Admission medications   Not on File    Physical Exam: Vitals:   07/22/19 0910 07/22/19 0912 07/22/19 0930 07/22/19 0945  BP:   132/78   Pulse: (!) 106 100 97 94  Resp: 14 19 20 20   Temp:      TempSrc:      SpO2: 97% 95% 95% 97%  Weight:      Height:       General: Not in acute distress HEENT:       Eyes: PERRL, EOMI, no scleral icterus.       ENT: No discharge from the ears and nose, no pharynx injection, no tonsillar enlargement.        Neck: No JVD, no bruit, no mass felt. Heme: No neck  lymph node enlargement. Cardiac: S1/S2, RRR, No murmurs, No gallops or rubs. Respiratory: No rales, wheezing, rhonchi or rubs. GI: Soft, nondistended, nontender, no rebound pain, no organomegaly, BS present. GU: No hematuria Ext: No pitting leg edema bilaterally. 2+DP/PT pulse bilaterally. Musculoskeletal: No joint deformities, No joint redness or warmth, no limitation of ROM in spin. Skin: No rashes.  Neuro: Alert, oriented X3, cranial nerves II-XII grossly intact, moves all extremities normally.  Psych: Patient is not psychotic, no suicidal or hemocidal ideation.  Labs on Admission: I have personally reviewed following labs and imaging studies  CBC: Recent Labs  Lab 07/22/19 0828  WBC 4.7   NEUTROABS 3.2  HGB 14.6  HCT 44.2  MCV 83.2  PLT 199   Basic Metabolic Panel: Recent Labs  Lab 07/22/19 0828  NA 131*  K 3.7  CL 99  CO2 23  GLUCOSE 190*  BUN 15  CREATININE 1.20  CALCIUM 9.0   GFR: Estimated Creatinine Clearance: 85.5 mL/min (by C-G formula based on SCr of 1.2 mg/dL). Liver Function Tests: No results for input(s): AST, ALT, ALKPHOS, BILITOT, PROT, ALBUMIN in the last 168 hours. No results for input(s): LIPASE, AMYLASE in the last 168 hours. No results for input(s): AMMONIA in the last 168 hours. Coagulation Profile: No results for input(s): INR, PROTIME in the last 168 hours. Cardiac Enzymes: No results for input(s): CKTOTAL, CKMB, CKMBINDEX, TROPONINI in the last 168 hours. BNP (last 3 results) No results for input(s): PROBNP in the last 8760 hours. HbA1C: No results for input(s): HGBA1C in the last 72 hours. CBG: No results for input(s): GLUCAP in the last 168 hours. Lipid Profile: No results for input(s): CHOL, HDL, LDLCALC, TRIG, CHOLHDL, LDLDIRECT in the last 72 hours. Thyroid Function Tests: No results for input(s): TSH, T4TOTAL, FREET4, T3FREE, THYROIDAB in the last 72 hours. Anemia Panel: No results for input(s): VITAMINB12, FOLATE, FERRITIN, TIBC, IRON, RETICCTPCT in the last 72 hours. Urine analysis: No results found for: COLORURINE, APPEARANCEUR, LABSPEC, PHURINE, GLUCOSEU, HGBUR, BILIRUBINUR, KETONESUR, PROTEINUR, UROBILINOGEN, NITRITE, LEUKOCYTESUR Sepsis Labs: @LABRCNTIP (procalcitonin:4,lacticidven:4) )No results found for this or any previous visit (from the past 240 hour(s)).   Radiological Exams on Admission: DG Chest Portable 1 View  Result Date: 07/22/2019 CLINICAL DATA:  Shortness of breath.  COVID-19 positive EXAM: PORTABLE CHEST 1 VIEW COMPARISON:  None. FINDINGS: There is ill-defined airspace opacity in the upper lobes, more on the right than on the left as well as in the lung base regions. There is no appreciable  consolidation. There is a questionable nipple shadow on the right. Heart size and pulmonary vascularity are normal. No adenopathy. No bone lesions. IMPRESSION: 1. Multifocal pneumonia without consolidation. Suspect atypical organism pneumonia, particularly given the history. 2. Opacity right base may represent a nipple shadow; advise repeat study with nipple markers to confirm that this nodular opacity indeed represents a nipple shadow as opposed to a pulmonary nodule. 3.  No evident adenopathy. Electronically Signed   By: 09/21/2019 III M.D.   On: 07/22/2019 09:22     EKG: Independently reviewed.  Sinus rhythm, QTC 431, LAE, nonspecific T wave change, T wave inversion in lead III   Assessment/Plan Principal Problem:   Pneumonia due to COVID-19 virus Active Problems:   Acute respiratory failure with hypoxia (HCC)   Alcohol use  Acute respiratory failure with hypoxia due to pneumonia due to COVID-19 virus: Oxygen desaturated to 88% on room air, which improved to 95% on 3 L nasal cannula oxygen.  Chest x-ray showed  bilateral multifocal infiltration.   -will admit to med-surg bed as inpt -Remdesivir per pharm -Solumedrol 60 mg bid -vitamin C, zinc.  -Bronchodilators -PRN Mucinex for cough -f/u Blood culture -Gentle IV fluid -D-dimer, BNP,Trop, LFT, CRP, LDH, Procalcitonin, Ferritin, fibinogen, TG, Hep B SAg, HIV ab -Daily CRP, Ferritin, D-dimer, -Will ask the patient to maintain an awake prone position for 16+ hours a day, if possible, with a minimum of 2-3 hours at a time -Will attempt to maintain euvolemia to a net negative fluid status -IF patient deteriorates, will consult PCCM and ID  Regarding off-label use of Actemra:  This patient has confirmed COVID-19. He has hypoxia and is high-risk for deteriorating. Pt is is not known to be on immunomodulators, anti-rejection medications, or cancer chemotherapy, has no history of TB or latent TB, and no history of diverticulitis or  intestinal perforation.  Platelets are >50K, ANC is >500, and ALT/AST are below 5x ULN with no known hepatitis B infection. The investigational nature of this medication was discussed with the patient. He chooses to proceed as the potential benefits are felt to outweigh risks at this time.  -Tocilizumab 8 mg/kg now -Monitor for infusion reaction  Alcohol use: -CiWA protocol     DVT ppx: SQ Lovenox Code Status: Full code Family Communication: not done, no family member is at bed side.  Disposition Plan:  Anticipate discharge back to previous home environment Consults called:  none Admission status: Med-surg bed as inpt     Status is: Inpatient Remains inpatient appropriate because:Inpatient level of care appropriate due to severity of illness Dispo: The patient is from: Home              Anticipated d/c is to: Home              Anticipated d/c date is: 2 days              Patient currently is not medically stable to d/c.     Inpatient status:  # Patient requires inpatient status due to high intensity of service, high risk for further deterioration and high frequency of surveillance required.  I certify that at the point of admission it is my clinical judgment that the patient will require inpatient hospital care spanning beyond 2 midnights from the point of admission. . The initial radiographic and laboratory data are worrisome because of multifocal infiltration on chest x-ray.   . Current medical needs: please see my assessment and plan Predictability of an adverse outcome (risk): Patient has multiple comorbidities as listed above. Now presents with acute respiratory failure due to pneumonia secondary to COVID-19 infection.  Patient has new oxygen requirement. Patient's presentation is highly complicated.  Patient is at high risk of deteriorating.  Will need to be treated in hospital for at least 2 days.            Date of Service 07/22/2019    Lorretta Harp [facility]   If 7PM-7AM, please contact night-coverage [email or web address] 07/22/2019, 9:59 AM

## 2019-07-22 NOTE — ED Notes (Signed)
Pt given turkey sandwich tray and water 

## 2019-07-22 NOTE — ED Notes (Signed)
Pt's thermostat temp lowered per request

## 2019-07-22 NOTE — ED Notes (Signed)
Pt given dinner tray.

## 2019-07-22 NOTE — Progress Notes (Signed)
Remdesivir - Pharmacy Brief Note   [facility].  O:  CXR: Multifocal pneumonia without consolidation. Suspect atypical organism pneumonia, particularly given the history. SpO2: 95% on 3 L [place]   A/P:  Remdesivir 200 mg IVPB once followed by 100 mg IVPB daily x 4 days.   Laureen Ochs, PharmD 07/22/2019 9:36 AM

## 2019-07-22 NOTE — ED Triage Notes (Signed)
Pt arrives via ems from home. Ems reports covid + on 4/29. Reports increased sob over last couple days. Reports taking only tylenol at home. Pt a&o x 4, nad noted at this time. MD present for assessment.

## 2019-07-23 LAB — COMPREHENSIVE METABOLIC PANEL
ALT: 44 U/L (ref 0–44)
AST: 38 U/L (ref 15–41)
Albumin: 3.1 g/dL — ABNORMAL LOW (ref 3.5–5.0)
Alkaline Phosphatase: 49 U/L (ref 38–126)
Anion gap: 7 (ref 5–15)
BUN: 21 mg/dL — ABNORMAL HIGH (ref 6–20)
CO2: 26 mmol/L (ref 22–32)
Calcium: 9 mg/dL (ref 8.9–10.3)
Chloride: 101 mmol/L (ref 98–111)
Creatinine, Ser: 1.15 mg/dL (ref 0.61–1.24)
GFR calc Af Amer: >60 mL/min (ref >60)
GFR calc non Af Amer: >60 mL/min (ref >60)
Glucose, Bld: 225 mg/dL — ABNORMAL HIGH (ref 70–99)
Potassium: 4.4 mmol/L (ref 3.5–5.1)
Sodium: 134 mmol/L — ABNORMAL LOW (ref 135–145)
Total Bilirubin: 0.5 mg/dL (ref 0.3–1.2)
Total Protein: 7.4 g/dL (ref 6.5–8.1)

## 2019-07-23 LAB — CBC WITH DIFFERENTIAL/PLATELET
Abs Immature Granulocytes: 0.02 10*3/uL (ref 0.00–0.07)
Basophils Absolute: 0 10*3/uL (ref 0.0–0.1)
Basophils Relative: 0 %
Eosinophils Absolute: 0 10*3/uL (ref 0.0–0.5)
Eosinophils Relative: 0 %
HCT: 41.2 % (ref 39.0–52.0)
Hemoglobin: 13.6 g/dL (ref 13.0–17.0)
Immature Granulocytes: 1 %
Lymphocytes Relative: 28 %
Lymphs Abs: 0.8 10*3/uL (ref 0.7–4.0)
MCH: 27.8 pg (ref 26.0–34.0)
MCHC: 33 g/dL (ref 30.0–36.0)
MCV: 84.1 fL (ref 80.0–100.0)
Monocytes Absolute: 0.1 10*3/uL (ref 0.1–1.0)
Monocytes Relative: 5 %
Neutro Abs: 1.9 10*3/uL (ref 1.7–7.7)
Neutrophils Relative %: 66 %
Platelets: 224 10*3/uL (ref 150–400)
RBC: 4.9 MIL/uL (ref 4.22–5.81)
RDW: 13.3 % (ref 11.5–15.5)
Smear Review: NORMAL
WBC: 2.8 10*3/uL — ABNORMAL LOW (ref 4.0–10.5)
nRBC: 0 % (ref 0.0–0.2)

## 2019-07-23 LAB — FIBRIN DERIVATIVES D-DIMER (ARMC ONLY): Fibrin derivatives D-dimer (ARMC): 531.74 ng/mL (FEU) — ABNORMAL HIGH (ref 0.00–499.00)

## 2019-07-23 LAB — FERRITIN: Ferritin: 627 ng/mL — ABNORMAL HIGH (ref 24–336)

## 2019-07-23 LAB — C-REACTIVE PROTEIN: CRP: 14 mg/dL — ABNORMAL HIGH (ref <1.0)

## 2019-07-23 NOTE — Progress Notes (Signed)
Assumed care of patient at this time

## 2019-07-23 NOTE — Progress Notes (Signed)
End of Shift Summary:  Date: 07/23/19 Shift: 0700-1500 Ambulatory: up ad lib Significant Events: telemetry d/c this shift per Dr. Mayford Knife. Second dose remdesivir given. No other significant events this shift.

## 2019-07-23 NOTE — Progress Notes (Signed)
Alexander Deleon requested a photo of patient's positive COVID19 test. Writer visualized positive result [facility] for July [facility], [facility] for this patient.

## 2019-07-23 NOTE — Progress Notes (Signed)
PROGRESS NOTE    Alexander Deleon  [id number] DOB: 10/10/1965 DOA: 07/22/2019 PCP: Preston Fleeting, MD       Assessment & Plan:   Principal Problem:   Pneumonia due to COVID-19 virus Active Problems:   Acute respiratory failure with hypoxia (HCC)   Alcohol use  COVID19 pneumonia: continue on IV remdesivir, solumedrol, vitamin C & zinc. S/p actemra x1. Continue on bronchodilators & mucinex prn for cough. Encourage incentive spirometry & flutter valve. Inflammatory markers are elevated. Blood cxs NGTD  Acute hypoxic respiratory failure: secondary to COVID19 pneumonia. Continue on supplemental oxygen and wean as tolerated  Leukopenia: secondary to COVID19 vs alcohol use. Will continue to monitor  Alcohol use: CIWA protocol    DVT prophylaxis: lovenox Code Status: full Family Communication:  Disposition Plan: will likely d/c back home vs home health. No barriers likely    Status is: Inpatient  Remains inpatient appropriate because:IV treatments appropriate due to intensity of illness or inability to take PO   Dispo: The patient is from: Home              Anticipated d/c is to: Home              Anticipated d/c date is: 1 day              Patient currently is not medically stable to d/c.         Consultants:      Procedures:    Antimicrobials:    Subjective: Pt c/o shortness of breath but better than day prior.   Objective: Vitals:   07/22/19 2018 07/22/19 2040 07/22/19 2300 07/23/19 0800  BP:   108/74 136/83  Pulse:   75 83  Resp:   20 16  Temp:  98.1 F (36.7 C) 97.9 F (36.6 C) 98.7 F (37.1 C)  TempSrc:  Oral Oral Oral  SpO2:   99% 98%  Weight: 96.4 kg     Height: 6\' 1"  (1.854 m)      No intake or output data in the 24 hours ending 07/23/19 1242 Filed Weights   07/22/19 0824 07/22/19 2018  Weight: 99.8 kg 96.4 kg    Examination:  General exam: Appears calm and comfortable  Respiratory system: decreased breath sounds  b/l Cardiovascular system: S1 & S2+. No  rubs, gallops or clicks.. Gastrointestinal system: Abdomen is nondistended, soft and nontender.  Normal bowel sounds heard. Central nervous system: Alert and oriented. Moves all 4 extremities  Psychiatry: Judgement and insight appear normal. Mood & affect appropriate.     Data Reviewed: I have personally reviewed following labs and imaging studies  CBC: Recent Labs  Lab 07/22/19 0828 07/23/19 0631  WBC 4.7 2.8*  NEUTROABS 3.2 1.9  HGB 14.6 13.6  HCT 44.2 41.2  MCV 83.2 84.1  PLT 199 224   Basic Metabolic Panel: Recent Labs  Lab 07/22/19 0828 07/23/19 0631  NA 131* 134*  K 3.7 4.4  CL 99 101  CO2 23 26  GLUCOSE 190* 225*  BUN 15 21*  CREATININE 1.20 1.15  CALCIUM 9.0 9.0   GFR: Estimated Creatinine Clearance: 87.8 mL/min (by C-G formula based on SCr of 1.15 mg/dL). Liver Function Tests: Recent Labs  Lab 07/22/19 0829 07/23/19 0631  AST 51* 38  ALT 45* 44  ALKPHOS 50 49  BILITOT 0.7 0.5  PROT 7.6 7.4  ALBUMIN 3.4* 3.1*   No results for input(s): LIPASE, AMYLASE in the last 168 hours. No results for input(s):  AMMONIA in the last 168 hours. Coagulation Profile: No results for input(s): INR, PROTIME in the last 168 hours. Cardiac Enzymes: No results for input(s): CKTOTAL, CKMB, CKMBINDEX, TROPONINI in the last 168 hours. BNP (last 3 results) No results for input(s): PROBNP in the last 8760 hours. HbA1C: No results for input(s): HGBA1C in the last 72 hours. CBG: No results for input(s): GLUCAP in the last 168 hours. Lipid Profile: Recent Labs    07/22/19 0828  TRIG 163*   Thyroid Function Tests: No results for input(s): TSH, T4TOTAL, FREET4, T3FREE, THYROIDAB in the last 72 hours. Anemia Panel: Recent Labs    07/22/19 0829 07/23/19 0631  FERRITIN 507* 627*   Sepsis Labs: Recent Labs  Lab 07/22/19 1416  PROCALCITON 0.10    Recent Results (from the past 240 hour(s))  Culture, blood (Routine X 2) w  Reflex to ID Panel     Status: None (Preliminary result)   Collection Time: 07/22/19  2:24 PM   Specimen: BLOOD  Result Value Ref Range Status   Specimen Description BLOOD RIGHT ANTECUBITAL  Final   Special Requests   Final    BOTTLES DRAWN AEROBIC AND ANAEROBIC Blood Culture adequate volume   Culture   Final    NO GROWTH < 24 HOURS Performed at [facility], [address], [place], [place] [address]    Report Status PENDING  Incomplete  Culture, blood (Routine X 2) w Reflex to ID Panel     Status: None (Preliminary result)   Collection Time: 07/22/19  2:24 PM   Specimen: BLOOD  Result Value Ref Range Status   Specimen Description BLOOD BLOOD RIGHT FOREARM  Final   Special Requests   Final    BOTTLES DRAWN AEROBIC AND ANAEROBIC Blood Culture adequate volume   Culture   Final    NO GROWTH < 24 HOURS Performed at [facility], [address], [place], [place] [address]    Report Status PENDING  Incomplete         Radiology Studies: DG Chest Portable 1 View  Result Date: 07/22/2019 CLINICAL DATA:  Shortness of breath.  COVID-19 positive EXAM: PORTABLE CHEST 1 VIEW COMPARISON:  None. FINDINGS: There is ill-defined airspace opacity in the upper lobes, more on the right than on the left as well as in the lung base regions. There is no appreciable consolidation. There is a questionable nipple shadow on the right. Heart size and pulmonary vascularity are normal. No adenopathy. No bone lesions. IMPRESSION: 1. Multifocal pneumonia without consolidation. Suspect atypical organism pneumonia, particularly given the history. 2. Opacity right base may represent a nipple shadow; advise repeat study with nipple markers to confirm that this nodular opacity indeed represents a nipple shadow as opposed to a pulmonary nodule. 3.  No evident adenopathy. Electronically Signed   By: Lowella Grip III M.D.   On: 07/22/2019 09:22        Scheduled Meds: . vitamin C  500 mg Oral  Daily  . dextromethorphan-guaiFENesin  1 tablet Oral BID  . enoxaparin (LOVENOX) injection  40 mg Subcutaneous Q24H  . folic acid  1 mg Oral Daily  . ipratropium  2 puff Inhalation Q4H  . LORazepam  0-4 mg Intravenous Q6H   Followed by  . [START ON 07/24/2019] LORazepam  0-4 mg Intravenous Q12H  . methylPREDNISolone (SOLU-MEDROL) injection  60 mg Intravenous Q12H  . multivitamin with minerals  1 tablet Oral Daily  . thiamine  100 mg Oral Daily   Or  .  thiamine  100 mg Intravenous Daily  . zinc sulfate  220 mg Oral Daily   Continuous Infusions: . remdesivir 100 mg in NS 100 mL 100 mg (07/23/19 1042)     LOS: 1 day    Time spent: 32 mins     Charise Killian, MD [facility] Pager [phone]  If 7PM-7AM, please contact night-coverage [email or web address] 07/23/2019, 12:42 PM

## 2019-07-24 LAB — CBC WITH DIFFERENTIAL/PLATELET
Abs Immature Granulocytes: 0.08 10*3/uL — ABNORMAL HIGH (ref 0.00–0.07)
Basophils Absolute: 0 10*3/uL (ref 0.0–0.1)
Basophils Relative: 0 %
Eosinophils Absolute: 0 10*3/uL (ref 0.0–0.5)
Eosinophils Relative: 0 %
HCT: 41.3 % (ref 39.0–52.0)
Hemoglobin: 13.9 g/dL (ref 13.0–17.0)
Immature Granulocytes: 1 %
Lymphocytes Relative: 13 %
Lymphs Abs: 0.9 10*3/uL (ref 0.7–4.0)
MCH: 27.8 pg (ref 26.0–34.0)
MCHC: 33.7 g/dL (ref 30.0–36.0)
MCV: 82.6 fL (ref 80.0–100.0)
Monocytes Absolute: 0.5 10*3/uL (ref 0.1–1.0)
Monocytes Relative: 7 %
Neutro Abs: 5.5 10*3/uL (ref 1.7–7.7)
Neutrophils Relative %: 79 %
Platelets: 292 10*3/uL (ref 150–400)
RBC: 5 MIL/uL (ref 4.22–5.81)
RDW: 13.2 % (ref 11.5–15.5)
WBC: 7 10*3/uL (ref 4.0–10.5)
nRBC: 0 % (ref 0.0–0.2)

## 2019-07-24 LAB — COMPREHENSIVE METABOLIC PANEL
ALT: 36 U/L (ref 0–44)
AST: 26 U/L (ref 15–41)
Albumin: 3.1 g/dL — ABNORMAL LOW (ref 3.5–5.0)
Alkaline Phosphatase: 44 U/L (ref 38–126)
Anion gap: 8 (ref 5–15)
BUN: 26 mg/dL — ABNORMAL HIGH (ref 6–20)
CO2: 24 mmol/L (ref 22–32)
Calcium: 9.2 mg/dL (ref 8.9–10.3)
Chloride: 103 mmol/L (ref 98–111)
Creatinine, Ser: 1.09 mg/dL (ref 0.61–1.24)
GFR calc Af Amer: >60 mL/min (ref >60)
GFR calc non Af Amer: >60 mL/min (ref >60)
Glucose, Bld: 250 mg/dL — ABNORMAL HIGH (ref 70–99)
Potassium: 4.5 mmol/L (ref 3.5–5.1)
Sodium: 135 mmol/L (ref 135–145)
Total Bilirubin: 0.4 mg/dL (ref 0.3–1.2)
Total Protein: 7 g/dL (ref 6.5–8.1)

## 2019-07-24 LAB — FIBRIN DERIVATIVES D-DIMER (ARMC ONLY): Fibrin derivatives D-dimer (ARMC): 481.37 ng/mL (FEU) (ref 0.00–499.00)

## 2019-07-24 LAB — FERRITIN: Ferritin: 726 ng/mL — ABNORMAL HIGH (ref 24–336)

## 2019-07-24 LAB — GLUCOSE, CAPILLARY: Glucose-Capillary: 234 mg/dL — ABNORMAL HIGH (ref 70–99)

## 2019-07-24 MED ORDER — INSULIN ASPART 100 UNIT/ML ~~LOC~~ SOLN
0.0000 [IU] | Freq: Three times a day (TID) | SUBCUTANEOUS | Status: DC
  Administered 2019-07-24 – 2019-07-25 (×2): 3 [IU] via SUBCUTANEOUS
  Administered 2019-07-25: 2 [IU] via SUBCUTANEOUS
  Administered 2019-07-25: 5 [IU] via SUBCUTANEOUS
  Administered 2019-07-26: 18:00:00 3 [IU] via SUBCUTANEOUS
  Administered 2019-07-26: 13:00:00 7 [IU] via SUBCUTANEOUS
  Administered 2019-07-26: 3 [IU] via SUBCUTANEOUS
  Filled 2019-07-24 (×6): qty 1

## 2019-07-24 NOTE — Progress Notes (Signed)
PROGRESS NOTE    Alexander Deleon  ZOX:[id number] DOB: 11/23/1965 DOA: 07/22/2019 PCP: Theotis Burrow, MD       Assessment & Plan:   Principal Problem:   Pneumonia due to COVID-19 virus Active Problems:   Acute respiratory failure with hypoxia ([place])   Alcohol use  COVID19 pneumonia: continue on IV remdesivir, solumedrol, vitamin c & zinc. Still w/ significant dyspnea w/ exertion only. S/p actemra x1. Continue on bronchodilators & mucinex prn for cough. Encourage incentive spirometry & flutter valve. Inflammatory markers are elevated. Blood cxs NGTD  Acute hypoxic respiratory failure: secondary to COVID19 pneumonia. Continue on supplemental oxygen and wean as tolerated. SaO2 drops w/ exertion   Leukopenia: resolved  Alcohol use: CIWA protocol    DVT prophylaxis: lovenox Code Status: full Family Communication:  Disposition Plan: will likely d/c back home vs home health. No barriers likely    Status is: Inpatient  Remains inpatient appropriate because:IV treatments appropriate due to intensity of illness or inability to take PO   Dispo: The patient is from: Home              Anticipated d/c is to: Home              Anticipated d/c date is: 2 days              Patient currently is not medically stable to d/c.         Consultants:      Procedures:    Antimicrobials:    Subjective: Pt c/o shortness of breath with exertion only   Objective: Vitals:   07/22/19 2300 07/23/19 0800 07/23/19 1644 07/24/19 0012  BP: 108/74 136/83 134/79 129/88  Pulse: 75 83 74 80  Resp: 20 16 19 16   Temp: 97.9 F (36.6 C) 98.7 F (37.1 C) 98.6 F (37 C) 97.8 F (36.6 C)  TempSrc: Oral Oral  Oral  SpO2: 99% 98% 96% 95%  Weight:      Height:        Intake/Output Summary (Last 24 hours) at 07/24/2019 0731 Last data filed at 07/24/2019 0405 Gross per 24 hour  Intake 95.62 ml  Output 750 ml  Net -654.38 ml   Filed Weights   07/22/19 0824 07/22/19 2018   Weight: 99.8 kg 96.4 kg    Examination:  General exam: Appears calm and comfortable  Respiratory system: decreased breath sounds b/l Cardiovascular system: S1 & S2+. No  rubs, gallops or clicks.. Gastrointestinal system: Abdomen is nondistended, soft and nontender.  Normal bowel sounds heard. Central nervous system: Alert and oriented. Moves all 4 extremities  Psychiatry: Judgement and insight appear normal. Mood & affect appropriate.     Data Reviewed: I have personally reviewed following labs and imaging studies  CBC: Recent Labs  Lab 07/22/19 0828 07/23/19 0631 07/24/19 0556  WBC 4.7 2.8* 7.0  NEUTROABS 3.2 1.9 5.5  HGB 14.6 13.6 13.9  HCT 44.2 41.2 41.3  MCV 83.2 84.1 82.6  PLT 199 224 811   Basic Metabolic Panel: Recent Labs  Lab 07/22/19 0828 07/23/19 0631 07/24/19 0556  NA 131* 134* 135  K 3.7 4.4 4.5  CL 99 101 103  CO2 23 26 24   GLUCOSE 190* 225* 250*  BUN 15 21* 26*  CREATININE 1.20 1.15 1.09  CALCIUM 9.0 9.0 9.2   GFR: Estimated Creatinine Clearance: 92.6 mL/min (by C-G formula based on SCr of 1.09 mg/dL). Liver Function Tests: Recent Labs  Lab 07/22/19 9147 07/23/19 0631 07/24/19 8295  AST 51* 38 26  ALT 45* 44 36  ALKPHOS 50 49 44  BILITOT 0.7 0.5 0.4  PROT 7.6 7.4 7.0  ALBUMIN 3.4* 3.1* 3.1*   No results for input(s): LIPASE, AMYLASE in the last 168 hours. No results for input(s): AMMONIA in the last 168 hours. Coagulation Profile: No results for input(s): INR, PROTIME in the last 168 hours. Cardiac Enzymes: No results for input(s): CKTOTAL, CKMB, CKMBINDEX, TROPONINI in the last 168 hours. BNP (last 3 results) No results for input(s): PROBNP in the last 8760 hours. HbA1C: No results for input(s): HGBA1C in the last 72 hours. CBG: No results for input(s): GLUCAP in the last 168 hours. Lipid Profile: Recent Labs    07/22/19 0828  TRIG 163*   Thyroid Function Tests: No results for input(s): TSH, T4TOTAL, FREET4, T3FREE,  THYROIDAB in the last 72 hours. Anemia Panel: Recent Labs    07/23/19 0631 07/24/19 0556  FERRITIN 627* 726*   Sepsis Labs: Recent Labs  Lab 07/22/19 1416  PROCALCITON 0.10    Recent Results (from the past 240 hour(s))  Culture, blood (Routine X 2) w Reflex to ID Panel     Status: None (Preliminary result)   Collection Time: 07/22/19  2:24 PM   Specimen: BLOOD  Result Value Ref Range Status   Specimen Description BLOOD RIGHT ANTECUBITAL  Final   Special Requests   Final    BOTTLES DRAWN AEROBIC AND ANAEROBIC Blood Culture adequate volume   Culture   Final    NO GROWTH 2 DAYS Performed at [facility], [address] [place], [place] [address]    Report Status PENDING  Incomplete  Culture, blood (Routine X 2) w Reflex to ID Panel     Status: None (Preliminary result)   Collection Time: 07/22/19  2:24 PM   Specimen: BLOOD  Result Value Ref Range Status   Specimen Description BLOOD BLOOD RIGHT FOREARM  Final   Special Requests   Final    BOTTLES DRAWN AEROBIC AND ANAEROBIC Blood Culture adequate volume   Culture   Final    NO GROWTH 2 DAYS Performed at [facility], [address], [place], [place] [address]    Report Status PENDING  Incomplete         Radiology Studies: DG Chest Portable 1 View  Result Date: 07/22/2019 CLINICAL DATA:  Shortness of breath.  COVID-19 positive EXAM: PORTABLE CHEST 1 VIEW COMPARISON:  None. FINDINGS: There is ill-defined airspace opacity in the upper lobes, more on the right than on the left as well as in the lung base regions. There is no appreciable consolidation. There is a questionable nipple shadow on the right. Heart size and pulmonary vascularity are normal. No adenopathy. No bone lesions. IMPRESSION: 1. Multifocal pneumonia without consolidation. Suspect atypical organism pneumonia, particularly given the history. 2. Opacity right base may represent a nipple shadow; advise repeat study with nipple markers to  confirm that this nodular opacity indeed represents a nipple shadow as opposed to a pulmonary nodule. 3.  No evident adenopathy. Electronically Signed   By: Bretta Bang III M.D.   On: 07/22/2019 09:22        Scheduled Meds: . vitamin C  500 mg Oral Daily  . dextromethorphan-guaiFENesin  1 tablet Oral BID  . enoxaparin (LOVENOX) injection  40 mg Subcutaneous Q24H  . folic acid  1 mg Oral Daily  . ipratropium  2 puff Inhalation Q4H  . LORazepam  0-4 mg Intravenous Q6H  Followed by  . LORazepam  0-4 mg Intravenous Q12H  . methylPREDNISolone (SOLU-MEDROL) injection  60 mg Intravenous Q12H  . multivitamin with minerals  1 tablet Oral Daily  . thiamine  100 mg Oral Daily   Or  . thiamine  100 mg Intravenous Daily  . zinc sulfate  220 mg Oral Daily   Continuous Infusions: . remdesivir 100 mg in NS 100 mL Stopped (07/23/19 1112)     LOS: 2 days    Time spent: 33 mins     Charise Killian, MD [facility] Pager [phone]  If 7PM-7AM, please contact night-coverage [email or web address] 07/24/2019, 7:31 AM

## 2019-07-25 LAB — GLUCOSE, CAPILLARY
Glucose-Capillary: 214 mg/dL — ABNORMAL HIGH (ref 70–99)
Glucose-Capillary: 294 mg/dL — ABNORMAL HIGH (ref 70–99)
Glucose-Capillary: 184 mg/dL — ABNORMAL HIGH (ref 70–99)
Glucose-Capillary: 191 mg/dL — ABNORMAL HIGH (ref 70–99)

## 2019-07-25 LAB — FIBRIN DERIVATIVES D-DIMER (ARMC ONLY): Fibrin derivatives D-dimer (ARMC): 419.42 ng/mL (FEU) (ref 0.00–499.00)

## 2019-07-25 LAB — CBC WITH DIFFERENTIAL/PLATELET
Abs Immature Granulocytes: 0.13 10*3/uL — ABNORMAL HIGH (ref 0.00–0.07)
Basophils Absolute: 0 10*3/uL (ref 0.0–0.1)
Basophils Relative: 0 %
Eosinophils Absolute: 0 10*3/uL (ref 0.0–0.5)
Eosinophils Relative: 0 %
HCT: 43.1 % (ref 39.0–52.0)
Hemoglobin: 14.2 g/dL (ref 13.0–17.0)
Immature Granulocytes: 2 %
Lymphocytes Relative: 11 %
Lymphs Abs: 0.9 10*3/uL (ref 0.7–4.0)
MCH: 27.8 pg (ref 26.0–34.0)
MCHC: 32.9 g/dL (ref 30.0–36.0)
MCV: 84.5 fL (ref 80.0–100.0)
Monocytes Absolute: 0.4 10*3/uL (ref 0.1–1.0)
Monocytes Relative: 5 %
Neutro Abs: 7 10*3/uL (ref 1.7–7.7)
Neutrophils Relative %: 82 %
Platelets: 336 10*3/uL (ref 150–400)
RBC: 5.1 MIL/uL (ref 4.22–5.81)
RDW: 13.1 % (ref 11.5–15.5)
WBC: 8.4 10*3/uL (ref 4.0–10.5)
nRBC: 0 % (ref 0.0–0.2)

## 2019-07-25 LAB — COMPREHENSIVE METABOLIC PANEL
ALT: 32 U/L (ref 0–44)
AST: 25 U/L (ref 15–41)
Albumin: 3.2 g/dL — ABNORMAL LOW (ref 3.5–5.0)
Alkaline Phosphatase: 46 U/L (ref 38–126)
Anion gap: 9 (ref 5–15)
BUN: 29 mg/dL — ABNORMAL HIGH (ref 6–20)
CO2: 23 mmol/L (ref 22–32)
Calcium: 9.1 mg/dL (ref 8.9–10.3)
Chloride: 102 mmol/L (ref 98–111)
Creatinine, Ser: 1.13 mg/dL (ref 0.61–1.24)
GFR calc Af Amer: >60 mL/min (ref >60)
GFR calc non Af Amer: >60 mL/min (ref >60)
Glucose, Bld: 241 mg/dL — ABNORMAL HIGH (ref 70–99)
Potassium: 4.5 mmol/L (ref 3.5–5.1)
Sodium: 134 mmol/L — ABNORMAL LOW (ref 135–145)
Total Bilirubin: 0.5 mg/dL (ref 0.3–1.2)
Total Protein: 7 g/dL (ref 6.5–8.1)

## 2019-07-25 LAB — HEMOGLOBIN A1C
Hgb A1c MFr Bld: 7.9 % — ABNORMAL HIGH (ref 4.8–5.6)
Mean Plasma Glucose: 180.03 mg/dL

## 2019-07-25 LAB — C-REACTIVE PROTEIN
CRP: 6.6 mg/dL — ABNORMAL HIGH (ref <1.0)
CRP: 2.8 mg/dL — ABNORMAL HIGH (ref <1.0)

## 2019-07-25 LAB — FERRITIN: Ferritin: 661 ng/mL — ABNORMAL HIGH (ref 24–336)

## 2019-07-25 NOTE — Progress Notes (Signed)
PROGRESS NOTE    Alexander Deleon  IWL:[id number] DOB: January 29, 1966 DOA: 07/22/2019 PCP: Theotis Burrow, MD       Assessment & Plan:   Principal Problem:   Pneumonia due to COVID-19 virus Active Problems:   Acute respiratory failure with hypoxia ([place])   Alcohol use  COVID19 pneumonia: continue on IV remdesivir, solumedrol, vitamin c & zinc. C/o significant dyspnea w/ exertion only. S/p actemra x1. Continue on bronchodilators & mucinex prn for cough. Encourage incentive spirometry & flutter valve. Inflammatory markers are elevated. Blood cxs NGTD. Continue on supplemental oxygen and wean as tolerated   Acute hypoxic respiratory failure: secondary to COVID19 pneumonia. Continue on supplemental oxygen and wean as tolerated.   Leukopenia: resolved  Alcohol use: CIWA protocol  Hyperglycemia: likely secondary to steroid use as no hx of DM. HbA1c pending    DVT prophylaxis: lovenox Code Status: full Family Communication:  Disposition Plan: will likely d/c back home vs home health. No barriers likely    Status is: Inpatient  Remains inpatient appropriate because:IV treatments appropriate due to intensity of illness or inability to take PO   Dispo: The patient is from: Home              Anticipated d/c is to: Home              Anticipated d/c date is: 1 days              Patient currently is not medically stable to d/c.     Consultants:      Procedures:    Antimicrobials:    Subjective: Pt c/o fatigue and shortness of breath especially w/ exertion.  Objective: Vitals:   07/23/19 1644 07/24/19 0012 07/24/19 0730 07/25/19 0833  BP: 134/79 129/88  139/85  Pulse: 74 80  72  Resp: 19 16  20   Temp: 98.6 F (37 C) 97.8 F (36.6 C)  98.2 F (36.8 C)  TempSrc:  Oral  Oral  SpO2: 96% 95% 97% 96%  Weight:      Height:        Intake/Output Summary (Last 24 hours) at 07/25/2019 1031 Last data filed at 07/24/2019 2342 Gross per 24 hour  Intake --  Output  1175 ml  Net -1175 ml   Filed Weights   07/22/19 0824 07/22/19 2018  Weight: 99.8 kg 96.4 kg    Examination:  General exam: Appears calm and comfortable  Respiratory system: diminished breath sounds b/l. No wheezes, rales Cardiovascular system: S1 & S2+. No  rubs, gallops or clicks.. Gastrointestinal system: Abdomen is nondistended, soft and nontender.  Hypoactive bowel sounds heard. Central nervous system: Alert and oriented. Moves all 4 extremities  Psychiatry: Judgement and insight appear normal. Mood & affect appropriate.     Data Reviewed: I have personally reviewed following labs and imaging studies  CBC: Recent Labs  Lab 07/22/19 0828 07/23/19 0631 07/24/19 0556 07/25/19 0647  WBC 4.7 2.8* 7.0 8.4  NEUTROABS 3.2 1.9 5.5 7.0  HGB 14.6 13.6 13.9 14.2  HCT 44.2 41.2 41.3 43.1  MCV 83.2 84.1 82.6 84.5  PLT 199 224 292 174   Basic Metabolic Panel: Recent Labs  Lab 07/22/19 0828 07/23/19 0631 07/24/19 0556 07/25/19 0647  NA 131* 134* 135 134*  K 3.7 4.4 4.5 4.5  CL 99 101 103 102  CO2 23 26 24 23   GLUCOSE 190* 225* 250* 241*  BUN 15 21* 26* 29*  CREATININE 1.20 1.15 1.09 1.13  CALCIUM 9.0 9.0  9.2 9.1   GFR: Estimated Creatinine Clearance: 89.3 mL/min (by C-G formula based on SCr of 1.13 mg/dL). Liver Function Tests: Recent Labs  Lab 07/22/19 0829 07/23/19 0631 07/24/19 0556 07/25/19 0647  AST 51* 38 26 25  ALT 45* 44 36 32  ALKPHOS 50 49 44 46  BILITOT 0.7 0.5 0.4 0.5  PROT 7.6 7.4 7.0 7.0  ALBUMIN 3.4* 3.1* 3.1* 3.2*   No results for input(s): LIPASE, AMYLASE in the last 168 hours. No results for input(s): AMMONIA in the last 168 hours. Coagulation Profile: No results for input(s): INR, PROTIME in the last 168 hours. Cardiac Enzymes: No results for input(s): CKTOTAL, CKMB, CKMBINDEX, TROPONINI in the last 168 hours. BNP (last 3 results) No results for input(s): PROBNP in the last 8760 hours. HbA1C: No results for input(s): HGBA1C in the  last 72 hours. CBG: Recent Labs  Lab 07/24/19 1717 07/25/19 0832  GLUCAP 234* 214*   Lipid Profile: No results for input(s): CHOL, HDL, LDLCALC, TRIG, CHOLHDL, LDLDIRECT in the last 72 hours. Thyroid Function Tests: No results for input(s): TSH, T4TOTAL, FREET4, T3FREE, THYROIDAB in the last 72 hours. Anemia Panel: Recent Labs    07/24/19 0556 07/25/19 0647  FERRITIN 726* 661*   Sepsis Labs: Recent Labs  Lab 07/22/19 1416  PROCALCITON 0.10    Recent Results (from the past 240 hour(s))  Culture, blood (Routine X 2) w Reflex to ID Panel     Status: None (Preliminary result)   Collection Time: 07/22/19  2:24 PM   Specimen: BLOOD  Result Value Ref Range Status   Specimen Description BLOOD RIGHT ANTECUBITAL  Final   Special Requests   Final    BOTTLES DRAWN AEROBIC AND ANAEROBIC Blood Culture adequate volume   Culture   Final    NO GROWTH 3 DAYS Performed at [facility], [address], [place], [place] [address]    Report Status PENDING  Incomplete  Culture, blood (Routine X 2) w Reflex to ID Panel     Status: None (Preliminary result)   Collection Time: 07/22/19  2:24 PM   Specimen: BLOOD  Result Value Ref Range Status   Specimen Description BLOOD BLOOD RIGHT FOREARM  Final   Special Requests   Final    BOTTLES DRAWN AEROBIC AND ANAEROBIC Blood Culture adequate volume   Culture   Final    NO GROWTH 3 DAYS Performed at [facility], [address], [place], [place] [address]    Report Status PENDING  Incomplete         Radiology Studies: No results found.      Scheduled Meds: . vitamin C  500 mg Oral Daily  . dextromethorphan-guaiFENesin  1 tablet Oral BID  . enoxaparin (LOVENOX) injection  40 mg Subcutaneous Q24H  . folic acid  1 mg Oral Daily  . insulin aspart  0-9 Units Subcutaneous TID WC  . ipratropium  2 puff Inhalation Q4H  . LORazepam  0-4 mg Intravenous Q12H  . methylPREDNISolone (SOLU-MEDROL) injection  60 mg  Intravenous Q12H  . multivitamin with minerals  1 tablet Oral Daily  . thiamine  100 mg Oral Daily   Or  . thiamine  100 mg Intravenous Daily  . zinc sulfate  220 mg Oral Daily   Continuous Infusions: . remdesivir 100 mg in NS 100 mL 100 mg (07/25/19 0946)     LOS: 3 days    Time spent: 35 mins     Charise Killian, MD [facility] Pager [phone]  If 7PM-7AM, please contact night-coverage [email or web address] 07/25/2019, 10:31 AM

## 2019-07-26 DIAGNOSIS — E118 Type 2 diabetes mellitus with unspecified complications: Secondary | ICD-10-CM

## 2019-07-26 LAB — CBC WITH DIFFERENTIAL/PLATELET
Abs Immature Granulocytes: 0.17 10*3/uL — ABNORMAL HIGH (ref 0.00–0.07)
Basophils Absolute: 0 10*3/uL (ref 0.0–0.1)
Basophils Relative: 0 %
Eosinophils Absolute: 0 10*3/uL (ref 0.0–0.5)
Eosinophils Relative: 0 %
HCT: 42.2 % (ref 39.0–52.0)
Hemoglobin: 14.3 g/dL (ref 13.0–17.0)
Immature Granulocytes: 2 %
Lymphocytes Relative: 12 %
Lymphs Abs: 0.9 10*3/uL (ref 0.7–4.0)
MCH: 27.9 pg (ref 26.0–34.0)
MCHC: 33.9 g/dL (ref 30.0–36.0)
MCV: 82.4 fL (ref 80.0–100.0)
Monocytes Absolute: 0.2 10*3/uL (ref 0.1–1.0)
Monocytes Relative: 3 %
Neutro Abs: 5.8 10*3/uL (ref 1.7–7.7)
Neutrophils Relative %: 83 %
Platelets: 323 10*3/uL (ref 150–400)
RBC: 5.12 MIL/uL (ref 4.22–5.81)
RDW: 13.2 % (ref 11.5–15.5)
WBC: 7.1 10*3/uL (ref 4.0–10.5)
nRBC: 0 % (ref 0.0–0.2)

## 2019-07-26 LAB — GLUCOSE, CAPILLARY
Glucose-Capillary: 218 mg/dL — ABNORMAL HIGH (ref 70–99)
Glucose-Capillary: 303 mg/dL — ABNORMAL HIGH (ref 70–99)
Glucose-Capillary: 243 mg/dL — ABNORMAL HIGH (ref 70–99)
Glucose-Capillary: 279 mg/dL — ABNORMAL HIGH (ref 70–99)

## 2019-07-26 LAB — COMPREHENSIVE METABOLIC PANEL
ALT: 28 U/L (ref 0–44)
AST: 21 U/L (ref 15–41)
Albumin: 3 g/dL — ABNORMAL LOW (ref 3.5–5.0)
Alkaline Phosphatase: 44 U/L (ref 38–126)
Anion gap: 8 (ref 5–15)
BUN: 32 mg/dL — ABNORMAL HIGH (ref 6–20)
CO2: 24 mmol/L (ref 22–32)
Calcium: 8.9 mg/dL (ref 8.9–10.3)
Chloride: 104 mmol/L (ref 98–111)
Creatinine, Ser: 1.06 mg/dL (ref 0.61–1.24)
GFR calc Af Amer: >60 mL/min (ref >60)
GFR calc non Af Amer: >60 mL/min (ref >60)
Glucose, Bld: 286 mg/dL — ABNORMAL HIGH (ref 70–99)
Potassium: 4.9 mmol/L (ref 3.5–5.1)
Sodium: 136 mmol/L (ref 135–145)
Total Bilirubin: 0.7 mg/dL (ref 0.3–1.2)
Total Protein: 6.7 g/dL (ref 6.5–8.1)

## 2019-07-26 LAB — FERRITIN: Ferritin: 682 ng/mL — ABNORMAL HIGH (ref 24–336)

## 2019-07-26 LAB — FIBRIN DERIVATIVES D-DIMER (ARMC ONLY): Fibrin derivatives D-dimer (ARMC): 500.73 ng/mL (FEU) — ABNORMAL HIGH (ref 0.00–499.00)

## 2019-07-26 NOTE — Progress Notes (Signed)
PROGRESS NOTE    Alexander Deleon  OVF:[id number] DOB: 10/06/1965 DOA: 07/22/2019 PCP: Preston Fleeting, MD       Assessment & Plan:   Principal Problem:   Pneumonia due to COVID-19 virus Active Problems:   Acute respiratory failure with hypoxia (HCC)   Alcohol use  COVID19 pneumonia: completed IV remdesivir. Continue on solumedrol, vitamin c & zinc. C/o significant dyspnea w/ exertion only. S/p actemra x1. Continue on bronchodilators & mucinex prn for cough. Encourage incentive spirometry & flutter valve. Inflammatory markers are elevated. Blood cxs NGTD. Weaned off of supplemental oxygen but was desaturating 07/25/19 with ambulation. Will get SaO2 again today w/ ambulating   DM2: poorly controlled. Previously took metformin but was unable to tolerate it and was never put on anything else. HbA1c 7.9.  DM coordinator consulted. Continue on SSI while inpatient and likely switch to [place] at d/c.  Acute hypoxic respiratory failure: secondary to COVID19 pneumonia. Wean off of supplemental oxygen   Leukopenia: resolved  Alcohol use: CIWA protocol  DM2: poorly controlled. Previously took metformin but was unable to tolerate it and was never put on anything else. HbA1c 7.9.  DM coordinator consulted. Continue on SSI while inpatient and likely switch to [place] at d/c.   DVT prophylaxis: lovenox Code Status: full Family Communication:  Disposition Plan: will likely d/c back home vs home health. No barriers likely    Status is: Inpatient  Remains inpatient appropriate because:IV treatments appropriate due to intensity of illness or inability to take PO   Dispo: The patient is from: Home              Anticipated d/c is to: Home              Anticipated d/c date is: 1 day              Patient currently is not medically stable to d/c.     Consultants:      Procedures:    Antimicrobials:    Subjective: Pt c/o shortness of breath with walking    Objective: Vitals:   07/24/19 0012 07/24/19 0730 07/25/19 0833 07/26/19 0026  BP: 129/88  139/85 134/86  Pulse: 80  72   Resp: 16  20 16   Temp: 97.8 F (36.6 C)  98.2 F (36.8 C) 98 F (36.7 C)  TempSrc: Oral  Oral Oral  SpO2: 95% 97% 96% 90%  Weight:      Height:        Intake/Output Summary (Last 24 hours) at 07/26/2019 0742 Last data filed at 07/25/2019 2200 Gross per 24 hour  Intake --  Output 500 ml  Net -500 ml   Filed Weights   07/22/19 0824 07/22/19 2018  Weight: 99.8 kg 96.4 kg    Examination:  General exam: Appears calm and comfortable  Respiratory system: decreased breath sounds b/l. No rhonchi Cardiovascular system: S1 & S2+. No  rubs, gallops or clicks.. Gastrointestinal system: Abdomen is nondistended, soft and nontender.  Normal bowel sounds heard. Central nervous system: Alert and oriented. Moves all 4 extremities  Psychiatry: Judgement and insight appear normal. Mood & affect appropriate.     Data Reviewed: I have personally reviewed following labs and imaging studies  CBC: Recent Labs  Lab 07/22/19 0828 07/23/19 0631 07/24/19 0556 07/25/19 0647 07/26/19 0500  WBC 4.7 2.8* 7.0 8.4 7.1  NEUTROABS 3.2 1.9 5.5 7.0 5.8  HGB 14.6 13.6 13.9 14.2 14.3  HCT 44.2 41.2 41.3 43.1 42.2  MCV 83.2  84.1 82.6 84.5 82.4  PLT 199 224 292 336 323   Basic Metabolic Panel: Recent Labs  Lab 07/22/19 0828 07/23/19 0631 07/24/19 0556 07/25/19 0647 07/26/19 0500  NA 131* 134* 135 134* 136  K 3.7 4.4 4.5 4.5 4.9  CL 99 101 103 102 104  CO2 23 26 24 23 24   GLUCOSE 190* 225* 250* 241* 286*  BUN 15 21* 26* 29* 32*  CREATININE 1.20 1.15 1.09 1.13 1.06  CALCIUM 9.0 9.0 9.2 9.1 8.9   GFR: Estimated Creatinine Clearance: 95.2 mL/min (by C-G formula based on SCr of 1.06 mg/dL). Liver Function Tests: Recent Labs  Lab 07/22/19 0829 07/23/19 0631 07/24/19 0556 07/25/19 0647 07/26/19 0500  AST 51* 38 26 25 21   ALT 45* 44 36 32 28  ALKPHOS 50 49 44 46 44   BILITOT 0.7 0.5 0.4 0.5 0.7  PROT 7.6 7.4 7.0 7.0 6.7  ALBUMIN 3.4* 3.1* 3.1* 3.2* 3.0*   No results for input(s): LIPASE, AMYLASE in the last 168 hours. No results for input(s): AMMONIA in the last 168 hours. Coagulation Profile: No results for input(s): INR, PROTIME in the last 168 hours. Cardiac Enzymes: No results for input(s): CKTOTAL, CKMB, CKMBINDEX, TROPONINI in the last 168 hours. BNP (last 3 results) No results for input(s): PROBNP in the last 8760 hours. HbA1C: Recent Labs    07/25/19 0647  HGBA1C 7.9*   CBG: Recent Labs  Lab 07/24/19 1717 07/25/19 0832 07/25/19 1151 07/25/19 1648 07/25/19 2146  GLUCAP 234* 214* 294* 184* 191*   Lipid Profile: No results for input(s): CHOL, HDL, LDLCALC, TRIG, CHOLHDL, LDLDIRECT in the last 72 hours. Thyroid Function Tests: No results for input(s): TSH, T4TOTAL, FREET4, T3FREE, THYROIDAB in the last 72 hours. Anemia Panel: Recent Labs    07/25/19 0647 07/26/19 0500  FERRITIN 661* 682*   Sepsis Labs: Recent Labs  Lab 07/22/19 1416  PROCALCITON 0.10    Recent Results (from the past 240 hour(s))  Culture, blood (Routine X 2) w Reflex to ID Panel     Status: None (Preliminary result)   Collection Time: 07/22/19  2:24 PM   Specimen: BLOOD  Result Value Ref Range Status   Specimen Description BLOOD RIGHT ANTECUBITAL  Final   Special Requests   Final    BOTTLES DRAWN AEROBIC AND ANAEROBIC Blood Culture adequate volume   Culture   Final    NO GROWTH 4 DAYS Performed at [facility], [address] [place], [address] [place]    Report Status PENDING  Incomplete  Culture, blood (Routine X 2) w Reflex to ID Panel     Status: None (Preliminary result)   Collection Time: 07/22/19  2:24 PM   Specimen: BLOOD  Result Value Ref Range Status   Specimen Description BLOOD BLOOD RIGHT FOREARM  Final   Special Requests   Final    BOTTLES DRAWN AEROBIC AND ANAEROBIC Blood Culture adequate volume   Culture   Final     NO GROWTH 4 DAYS Performed at [facility], [address] [place], [address] [place]    Report Status PENDING  Incomplete         Radiology Studies: No results found.      Scheduled Meds: . vitamin C  500 mg Oral Daily  . dextromethorphan-guaiFENesin  1 tablet Oral BID  . enoxaparin (LOVENOX) injection  40 mg Subcutaneous Q24H  . folic acid  1 mg Oral Daily  . insulin aspart  0-9 Units Subcutaneous TID WC  .  ipratropium  2 puff Inhalation Q4H  . LORazepam  0-4 mg Intravenous Q12H  . methylPREDNISolone (SOLU-MEDROL) injection  60 mg Intravenous Q12H  . multivitamin with minerals  1 tablet Oral Daily  . thiamine  100 mg Oral Daily  . zinc sulfate  220 mg Oral Daily   Continuous Infusions: . remdesivir 100 mg in NS 100 mL Stopped (07/25/19 1100)     LOS: 4 days    Time spent: 38 mins     Wyvonnia Dusky, MD [facility] Pager [phone]  If 7PM-7AM, please contact night-coverage [email or web address] 07/26/2019, 7:42 AM

## 2019-07-27 LAB — CBC WITH DIFFERENTIAL/PLATELET
Abs Immature Granulocytes: 0.29 10*3/uL — ABNORMAL HIGH (ref 0.00–0.07)
Basophils Absolute: 0 10*3/uL (ref 0.0–0.1)
Basophils Relative: 0 %
Eosinophils Absolute: 0 10*3/uL (ref 0.0–0.5)
Eosinophils Relative: 0 %
HCT: 43.4 % (ref 39.0–52.0)
Hemoglobin: 14.6 g/dL (ref 13.0–17.0)
Immature Granulocytes: 3 %
Lymphocytes Relative: 9 %
Lymphs Abs: 1 10*3/uL (ref 0.7–4.0)
MCH: 27.6 pg (ref 26.0–34.0)
MCHC: 33.6 g/dL (ref 30.0–36.0)
MCV: 82 fL (ref 80.0–100.0)
Monocytes Absolute: 0.4 10*3/uL (ref 0.1–1.0)
Monocytes Relative: 4 %
Neutro Abs: 9.1 10*3/uL — ABNORMAL HIGH (ref 1.7–7.7)
Neutrophils Relative %: 84 %
Platelets: 377 10*3/uL (ref 150–400)
RBC: 5.29 MIL/uL (ref 4.22–5.81)
RDW: 13.1 % (ref 11.5–15.5)
WBC: 10.9 10*3/uL — ABNORMAL HIGH (ref 4.0–10.5)
nRBC: 0 % (ref 0.0–0.2)

## 2019-07-27 LAB — FIBRIN DERIVATIVES D-DIMER (ARMC ONLY): Fibrin derivatives D-dimer (ARMC): 357.41 ng/mL (FEU) (ref 0.00–499.00)

## 2019-07-27 LAB — CULTURE, BLOOD (ROUTINE X 2)
Culture: NO GROWTH
Culture: NO GROWTH
Special Requests: ADEQUATE
Special Requests: ADEQUATE

## 2019-07-27 LAB — COMPREHENSIVE METABOLIC PANEL
ALT: 26 U/L (ref 0–44)
AST: 18 U/L (ref 15–41)
Albumin: 3.1 g/dL — ABNORMAL LOW (ref 3.5–5.0)
Alkaline Phosphatase: 43 U/L (ref 38–126)
Anion gap: 8 (ref 5–15)
BUN: 30 mg/dL — ABNORMAL HIGH (ref 6–20)
CO2: 22 mmol/L (ref 22–32)
Calcium: 9.1 mg/dL (ref 8.9–10.3)
Chloride: 102 mmol/L (ref 98–111)
Creatinine, Ser: 0.94 mg/dL (ref 0.61–1.24)
GFR calc Af Amer: >60 mL/min (ref >60)
GFR calc non Af Amer: >60 mL/min (ref >60)
Glucose, Bld: 285 mg/dL — ABNORMAL HIGH (ref 70–99)
Potassium: 4.7 mmol/L (ref 3.5–5.1)
Sodium: 132 mmol/L — ABNORMAL LOW (ref 135–145)
Total Bilirubin: 0.7 mg/dL (ref 0.3–1.2)
Total Protein: 6.7 g/dL (ref 6.5–8.1)

## 2019-07-27 LAB — GLUCOSE, CAPILLARY
Glucose-Capillary: 224 mg/dL — ABNORMAL HIGH (ref 70–99)
Glucose-Capillary: 255 mg/dL — ABNORMAL HIGH (ref 70–99)

## 2019-07-27 LAB — C-REACTIVE PROTEIN
CRP: 1.5 mg/dL — ABNORMAL HIGH (ref <1.0)
CRP: 1 mg/dL — ABNORMAL HIGH (ref <1.0)

## 2019-07-27 LAB — FERRITIN: Ferritin: 593 ng/mL — ABNORMAL HIGH (ref 24–336)

## 2019-07-27 MED ORDER — INSULIN ASPART 100 UNIT/ML ~~LOC~~ SOLN: 0.0000 [IU] | Freq: Every day | SUBCUTANEOUS | Status: DC

## 2019-07-27 MED ORDER — DEXAMETHASONE 2 MG PO TABS
6.0000 mg | ORAL_TABLET | Freq: Every day | ORAL | 0 refills | Status: AC
Start: 2019-07-27 — End: 2019-08-01

## 2019-07-27 MED ORDER — SITAGLIPTIN PHOSPHATE 100 MG PO TABS
100.0000 mg | ORAL_TABLET | Freq: Every day | ORAL | 0 refills | Status: DC
Start: 2019-07-27 — End: 2021-02-14

## 2019-07-27 MED ORDER — INSULIN ASPART 100 UNIT/ML ~~LOC~~ SOLN
0.0000 [IU] | Freq: Three times a day (TID) | SUBCUTANEOUS | Status: DC
  Administered 2019-07-27: 8 [IU] via SUBCUTANEOUS
  Administered 2019-07-27: 5 [IU] via SUBCUTANEOUS
  Filled 2019-07-27 (×2): qty 1

## 2019-07-27 NOTE — Discharge Summary (Signed)
Physician Discharge Summary  KOHLER PELLERITO Deleon:[id number] DOB: Mar 10, 1966 DOA: 07/22/2019  PCP: Preston Fleeting, MD  Admit date: 07/22/2019 Discharge date: 07/27/2019  Admitted From: home Disposition:  Home   Recommendations for Outpatient Follow-up:  1. Follow up with PCP in 1 week  Home Health: no Equipment/Devices:  Discharge Condition: stable CODE STATUS: full  Diet recommendation:  Carb Modified  Brief/Interim Summary: HPI was taken from Dr. Clyde Lundborg: Alexander Deleon is a 54 y.o. male with without significant past medical history except for alcohol use, who presents with shortness of breath.   Patient has been having shortness breath for about 1 week, which has been progressively worsening. He also has cough, generalized weakness and malaise. He was tested positive for COVID-19 at [facility] on April 29 ( he has positive Covid test document in his cell phone per EDP). He has poor appetite and decreased oral intake. He has had one diarrhea toady.  No nausea or vomiting.  He has minimal lower abdominal pain.  Patient does not have chest pain, symptoms of UTI or unilateral weakness. Patient has fever of 101.1 in ED. He was found to have oxygen desaturation to 88% on room air, which improved to 95% on 3 L nasal cannula oxygen in ED.  ED Course: pt was found to have WBC 4.7, trop 5, GFR >60, temperature 101.1, blood pressure 130/92, tachycardia.  Chest x-ray showed bilateral multifocal infiltration. Pt is admitted to MedSurg bed as inpatient.   Hospital Course from Dr. Wilfred Lacy 5/6-5/10/21: Pt was found to have COVID19 pneumonia on admission. Pt was treated w/ IV remdesivir, solumedrol, vitamin c, zinc, actemra x 1, bronchodilators & supplemental oxygen. Pt was able to weaned from supplemental oxygen prior to d/c. Initially, pt was c/o shortness of breath especially with exertion which also resolved prior to d/c. Pt was d/c home w/ po decadron to finish the course at home. Of note, pt did not  mention he had DM2 on admission but was later found out to have a HbA1c of 7.9. Pt did receive insulin while inpatient to control his blood sugars. Pt was started on januvia at d/c as pt previously did not tolerate metformin and was never put on anything else. Pt did receive DM education. Pt verbalized his understanding.   Discharge Diagnoses:  Principal Problem:   Pneumonia due to COVID-19 virus Active Problems:   Acute respiratory failure with hypoxia (HCC)   Alcohol use  COVID19 pneumonia: completed IV remdesivir. Continue on solumedrol, vitamin c & zinc.S/p actemra x1. Continue on bronchodilators & mucinex prn for cough. Encourage incentive spirometry & flutter valve. Inflammatory markers are elevated. Blood cxs NGTD. Weaned off of supplemental oxygen and no longer requiring oxygen w/ exertion   DM2: poorly controlled. Previously took metformin but was unable to tolerate it and was never put on anything else. HbA1c 7.9.  DM coordinator following. Continue on SSI while inpatient and switch to [place] at d/c.  Acute hypoxic respiratory failure: secondary to COVID19 pneumonia. Wean off of supplemental oxygen   Leukopenia: resolved  Alcohol use: CIWA protocol   Discharge Instructions  Discharge Instructions    Diet Carb Modified   Complete by: As directed    Discharge instructions   Complete by: As directed    F/u PCP in 1 week   Increase activity slowly   Complete by: As directed      Allergies as of 07/27/2019   No Known Allergies     Medication List  TAKE these medications   dexamethasone 2 MG tablet Commonly known as: DECADRON Take 3 tablets (6 mg total) by mouth daily for 5 days.   sitaGLIPtin 100 MG tablet Commonly known as: Januvia Take 1 tablet (100 mg total) by mouth daily.       No Known Allergies  Consultations:   Procedures/Studies: DG Chest Portable 1 View  Result Date: 07/22/2019 CLINICAL DATA:  Shortness of breath.  COVID-19 positive EXAM:  PORTABLE CHEST 1 VIEW COMPARISON:  None. FINDINGS: There is ill-defined airspace opacity in the upper lobes, more on the right than on the left as well as in the lung base regions. There is no appreciable consolidation. There is a questionable nipple shadow on the right. Heart size and pulmonary vascularity are normal. No adenopathy. No bone lesions. IMPRESSION: 1. Multifocal pneumonia without consolidation. Suspect atypical organism pneumonia, particularly given the history. 2. Opacity right base may represent a nipple shadow; advise repeat study with nipple markers to confirm that this nodular opacity indeed represents a nipple shadow as opposed to a pulmonary nodule. 3.  No evident adenopathy. Electronically Signed   By: Lowella Grip III M.D.   On: 07/22/2019 09:22       Subjective: Pt c/o fatigue    Discharge Exam: Vitals:   07/27/19 0759 07/27/19 1100  BP: 128/90   Pulse: 78   Resp: (!) 22   Temp: 98.3 F (36.8 C)   SpO2: 97% 94%   Vitals:   07/26/19 1618 07/27/19 0022 07/27/19 0759 07/27/19 1100  BP: (!) 137/93 131/87 128/90   Pulse: 68 64 78   Resp: 17 15 (!) 22   Temp: 98.2 F (36.8 C) 98.3 F (36.8 C) 98.3 F (36.8 C)   TempSrc: Oral Oral Oral   SpO2: 96% 95% 97% 94%  Weight:      Height:        General: Pt is alert, awake, not in acute distress Cardiovascular: S1/S2 +, no rubs, no gallops Respiratory: diminished breath sounds b/l, no wheezing, no rales Abdominal: Soft, NT, ND, bowel sounds + Extremities: no edema, no cyanosis Psych: agitated     The results of significant diagnostics from this hospitalization (including imaging, microbiology, ancillary and laboratory) are listed below for reference.     Microbiology: Recent Results (from the past 240 hour(s))  Culture, blood (Routine X 2) w Reflex to ID Panel     Status: None   Collection Time: 07/22/19  2:24 PM   Specimen: BLOOD  Result Value Ref Range Status   Specimen Description BLOOD RIGHT  ANTECUBITAL  Final   Special Requests   Final    BOTTLES DRAWN AEROBIC AND ANAEROBIC Blood Culture adequate volume   Culture   Final    NO GROWTH 5 DAYS Performed at [facility], [address] [place], [place] [address]    Report Status 07/27/2019 FINAL  Final  Culture, blood (Routine X 2) w Reflex to ID Panel     Status: None   Collection Time: 07/22/19  2:24 PM   Specimen: BLOOD  Result Value Ref Range Status   Specimen Description BLOOD BLOOD RIGHT FOREARM  Final   Special Requests   Final    BOTTLES DRAWN AEROBIC AND ANAEROBIC Blood Culture adequate volume   Culture   Final    NO GROWTH 5 DAYS Performed at [facility], [address], [place], [place] [address]    Report Status 07/27/2019 FINAL  Final     Labs: BNP (  last 3 results) Recent Labs    07/22/19 1416  BNP 85.0   Basic Metabolic Panel: Recent Labs  Lab 07/23/19 0631 07/24/19 0556 07/25/19 0647 07/26/19 0500 07/27/19 0546  NA 134* 135 134* 136 132*  K 4.4 4.5 4.5 4.9 4.7  CL 101 103 102 104 102  CO2 26 24 23 24 22   GLUCOSE 225* 250* 241* 286* 285*  BUN 21* 26* 29* 32* 30*  CREATININE 1.15 1.09 1.13 1.06 0.94  CALCIUM 9.0 9.2 9.1 8.9 9.1   Liver Function Tests: Recent Labs  Lab 07/23/19 0631 07/24/19 0556 07/25/19 0647 07/26/19 0500 07/27/19 0546  AST 38 26 25 21 18   ALT 44 36 32 28 26  ALKPHOS 49 44 46 44 43  BILITOT 0.5 0.4 0.5 0.7 0.7  PROT 7.4 7.0 7.0 6.7 6.7  ALBUMIN 3.1* 3.1* 3.2* 3.0* 3.1*   No results for input(s): LIPASE, AMYLASE in the last 168 hours. No results for input(s): AMMONIA in the last 168 hours. CBC: Recent Labs  Lab 07/23/19 0631 07/24/19 0556 07/25/19 0647 07/26/19 0500 07/27/19 0546  WBC 2.8* 7.0 8.4 7.1 10.9*  NEUTROABS 1.9 5.5 7.0 5.8 9.1*  HGB 13.6 13.9 14.2 14.3 14.6  HCT 41.2 41.3 43.1 42.2 43.4  MCV 84.1 82.6 84.5 82.4 82.0  PLT 224 292 336 323 377   Cardiac Enzymes: No results for input(s): CKTOTAL, CKMB, CKMBINDEX,  TROPONINI in the last 168 hours. BNP: Invalid input(s): POCBNP CBG: Recent Labs  Lab 07/26/19 1158 07/26/19 1617 07/26/19 2139 07/27/19 0755 07/27/19 1126  GLUCAP 303* 243* 279* 224* 255*   D-Dimer No results for input(s): DDIMER in the last 72 hours. Hgb A1c Recent Labs    07/25/19 0647  HGBA1C 7.9*   Lipid Profile No results for input(s): CHOL, HDL, LDLCALC, TRIG, CHOLHDL, LDLDIRECT in the last 72 hours. Thyroid function studies No results for input(s): TSH, T4TOTAL, T3FREE, THYROIDAB in the last 72 hours.  Invalid input(s): FREET3 Anemia work up Recent Labs    07/26/19 0500 07/27/19 0546  FERRITIN 682* 593*   Urinalysis No results found for: COLORURINE, APPEARANCEUR, LABSPEC, PHURINE, GLUCOSEU, HGBUR, BILIRUBINUR, KETONESUR, PROTEINUR, UROBILINOGEN, NITRITE, LEUKOCYTESUR Sepsis Labs Invalid input(s): PROCALCITONIN,  WBC,  LACTICIDVEN Microbiology Recent Results (from the past 240 hour(s))  Culture, blood (Routine X 2) w Reflex to ID Panel     Status: None   Collection Time: 07/22/19  2:24 PM   Specimen: BLOOD  Result Value Ref Range Status   Specimen Description BLOOD RIGHT ANTECUBITAL  Final   Special Requests   Final    BOTTLES DRAWN AEROBIC AND ANAEROBIC Blood Culture adequate volume   Culture   Final    NO GROWTH 5 DAYS Performed at [facility], [address] [place], [address] [place]    Report Status 07/27/2019 FINAL  Final  Culture, blood (Routine X 2) w Reflex to ID Panel     Status: None   Collection Time: 07/22/19  2:24 PM   Specimen: BLOOD  Result Value Ref Range Status   Specimen Description BLOOD BLOOD RIGHT FOREARM  Final   Special Requests   Final    BOTTLES DRAWN AEROBIC AND ANAEROBIC Blood Culture adequate volume   Culture   Final    NO GROWTH 5 DAYS Performed at [facility], [address], [place], [address] [place]    Report Status 07/27/2019 FINAL  Final     Time coordinating discharge: Over 30  minutes  SIGNED:   [id number]  Artelia Laroche, MD  [facility] 07/27/2019, 1:16 PM Pager   If 7PM-7AM, please contact night-coverage [email or web address]

## 2019-07-27 NOTE — Progress Notes (Signed)
Discharge instructions provided. All questions answered. Breathing is even and unlabored. No distress is noted. All safety measures are in place. IV removed. All personal belongings gathered and taken with patient. RN wheeled patient out.

## 2019-07-27 NOTE — Progress Notes (Signed)
Notified Dr. Mayford Knife that RN ambulated patient in room on room air. Patient did not complain of any shortness of breath. The lowest patient's oxygen saturation got was 89%. HR elevated to 120s (asymptomatic). Back at rest, patient's oxygen saturation back up to above 94% and HR anywhere from 95-105. MD acknowledged this and states that patient is safe to dc home without oxygen, and HR is not a concern. Patient is agreeable to this. All safety measures in place, will continue to monitor.

## 2019-07-27 NOTE — Progress Notes (Signed)
Inpatient Diabetes Program Recommendations  AACE/ADA: New Consensus Statement on Inpatient Glycemic Control (2015)  Target Ranges:  Prepandial:   less than 140 mg/dL      Peak postprandial:   less than 180 mg/dL (1-2 hours)      Critically ill patients:  140 - 180 mg/dL   Results for Alexander Deleon, Alexander Deleon (MRN [id number]) as of 07/27/2019 07:20  Ref. Range 07/26/2019 08:16 07/26/2019 11:58 07/26/2019 16:17 07/26/2019 21:39  Glucose-Capillary Latest Ref Range: 70 - 99 mg/dL 628 (H)  3 units NOVOLOG  303 (H)  7 units NOVOLOG  243 (H)  3 units NOVOLOG  279 (H)   Results for [name], [name] (MRN [id number]) as of 07/27/2019 07:20  Ref. Range 07/25/2019 06:47  Hemoglobin A1C Latest Ref Range: 4.8 - 5.6 % 7.9 (H)     Admit 05/05 with Acute respiratory failure with hypoxia due to pneumonia due to COVID-19 virus  History: ETOH Abuse  Home DM Meds: None  Current Orders: Novolog Sensitive Correction Scale/ SSI (0-9 units) TID AC     Per MD notes, pt previously took Metformin but was unable to tolerate and was never started on any other diabetes meds.    Getting Solumedrol 60 mg BID    MD- Note Current A1c of 7.9%.  Pt will need some sort of medication to control CBGs at home after discharge.  Please consider the following:  1. Start Tradjenta 5 mg Daily (DPP-4 inhibitor that has been shown to help reduce severity of COVID in pts with diabetes).  Pt could also potentially continue this med at time of discharge home  2. May consider adding Novolog Meal Coverage as well while still inpatient and getting Solumedrol:  Novolog 6 units TID with meals     --Will follow patient during hospitalization--  Ambrose Finland RN, MSN, CDE Diabetes Coordinator Inpatient Glycemic Control Team Team Pager: [phone] (8a-5p)

## 2019-07-27 NOTE — Progress Notes (Signed)
SATURATION QUALIFICATIONS: (This note is used to comply with regulatory documentation for home oxygen)  Patient Saturations on Room Air at Rest = 97%  Patient Saturations on Room Air while Ambulating = 89%  Patient Saturations on 0 Liters of oxygen while Ambulating = 89%  Please briefly explain why patient needs home oxygen: Does not qualify

## 2019-08-26 ENCOUNTER — Ambulatory Visit
Admission: RE | Admit: 2019-08-26 | Discharge: 2019-08-26 | Disposition: A | Discharge status: Home / Self Care | Payer: BC Managed Care – PPO | Source: Ambulatory Visit | Attending: Family Medicine | Admitting: Family Medicine

## 2019-08-26 ENCOUNTER — Other Ambulatory Visit: Payer: Self-pay | Admitting: Family Medicine

## 2019-08-26 ENCOUNTER — Other Ambulatory Visit: Payer: Self-pay

## 2019-08-26 ENCOUNTER — Ambulatory Visit
Admission: RE | Admit: 2019-08-26 | Discharge: 2019-08-26 | Disposition: A | Discharge status: Home / Self Care | Payer: BC Managed Care – PPO | Attending: Family Medicine | Admitting: Family Medicine

## 2019-08-26 DIAGNOSIS — Z8616 Personal history of COVID-19: Secondary | ICD-10-CM

## 2019-08-26 DIAGNOSIS — Z8619 Personal history of other infectious and parasitic diseases: Secondary | ICD-10-CM | POA: Diagnosis present

## 2019-08-26 DIAGNOSIS — R509 Fever, unspecified: Secondary | ICD-10-CM | POA: Diagnosis present

## 2019-08-26 DIAGNOSIS — R0602 Shortness of breath: Secondary | ICD-10-CM

## 2019-09-15 ENCOUNTER — Other Ambulatory Visit: Payer: Self-pay

## 2019-09-15 ENCOUNTER — Ambulatory Visit: Payer: Self-pay | Admitting: Physician Assistant

## 2019-09-15 DIAGNOSIS — Z113 Encounter for screening for infections with a predominantly sexual mode of transmission: Secondary | ICD-10-CM

## 2019-09-15 LAB — GRAM STAIN

## 2019-09-16 ENCOUNTER — Encounter: Payer: Self-pay | Admitting: Physician Assistant

## 2019-09-16 NOTE — Progress Notes (Signed)
   [facility] Department STI clinic/screening visit  Subjective:  Alexander Deleon is a 54 y.o. male being seen today for an STI screening visit. The patient reports they do have symptoms.    Patient has the following medical conditions:   Patient Active Problem List   Diagnosis Date Noted  . Pneumonia due to COVID-19 virus 07/22/2019  . Acute respiratory failure with hypoxia (HCC) 07/22/2019  . Alcohol use 07/22/2019     Chief Complaint  Patient presents with  . SEXUALLY TRANSMITTED DISEASE    screening    HPI  Patient reports that "something doesn't feel right" after receiving oral sex 4-5 days ago.  States that he thinks that his partner was "too rough" and that he wants a screening to make sure that everything is ok.  States that he had a HIV test 1 week ago.   See flowsheet for further details and programmatic requirements.    The following portions of the patient's history were reviewed and updated as appropriate: allergies, current medications, past medical history, past social history, past surgical history and problem list.  Objective:  There were no vitals filed for this visit.  Physical Exam Constitutional:      General: He is not in acute distress.    Appearance: Normal appearance.  HENT:     Head: Normocephalic and atraumatic.     Comments: No nits, lice, or hair loss. No cervical, supraclavicular or axillary adenopathy.    Mouth/Throat:     Mouth: Mucous membranes are moist.     Pharynx: Oropharynx is clear. No oropharyngeal exudate or posterior oropharyngeal erythema.  Eyes:     Conjunctiva/sclera: Conjunctivae normal.  Pulmonary:     Effort: Pulmonary effort is normal.  Abdominal:     Palpations: Abdomen is soft. There is no mass.     Tenderness: There is no abdominal tenderness. There is no guarding or rebound.  Genitourinary:    Penis: Normal.      Testes: Normal.     Comments: Pubic area without nits, lice, edema, erythema,lesions  and inguinal adenopathy. Penis circumcised, without rash, lesions and discharge from meatus. Musculoskeletal:     Cervical back: Neck supple. No tenderness.  Skin:    General: Skin is warm and dry.     Findings: No bruising, erythema, lesion or rash.  Neurological:     Mental Status: He is alert and oriented to person, place, and time.  Psychiatric:        Mood and Affect: Mood normal.        Behavior: Behavior normal.        Thought Content: Thought content normal.        Judgment: Judgment normal.       Assessment and Plan:  DERALD LORGE is a 54 y.o. male presenting to the [facility] Department for STI screening  1. Screening for STD (sexually transmitted disease) Patient into clinic with symptoms today.  Patient declines blood work today. Reviewed exam findings and Gram stain results with patient today and no treatment is indicated. Rec condoms with all sex. Await test results.  Counseled that RN will call if needs to RTC for further treatment once results are back.  - Gram stain - Gonococcus culture     No follow-ups on file.  No future appointments.  Matt Holmes, PA

## 2019-09-20 LAB — GONOCOCCUS CULTURE

## 2020-03-25 ENCOUNTER — Other Ambulatory Visit: Payer: Self-pay

## 2020-03-25 ENCOUNTER — Ambulatory Visit: Payer: BC Managed Care – PPO | Admitting: Family Medicine

## 2020-03-25 ENCOUNTER — Encounter: Payer: Self-pay | Admitting: Family Medicine

## 2020-03-25 DIAGNOSIS — Z113 Encounter for screening for infections with a predominantly sexual mode of transmission: Secondary | ICD-10-CM

## 2020-03-25 LAB — GRAM STAIN

## 2020-03-25 NOTE — Progress Notes (Signed)
   [facility] Department STI clinic/screening visit  Subjective:  TRAVIAN KERNER is a 55 y.o. male being seen today for an STI screening visit. The patient reports they do not have symptoms.    Patient has the following medical conditions:   Patient Active Problem List   Diagnosis Date Noted  . Pneumonia due to COVID-19 virus 07/22/2019  . Acute respiratory failure with hypoxia (HCC) 07/22/2019  . Alcohol use 07/22/2019     Chief Complaint  Patient presents with  . SEXUALLY TRANSMITTED DISEASE    HPI  Patient reports here for screening, "I come at the beginning of every year and July"   See flowsheet for further details and programmatic requirements.    The following portions of the patient's history were reviewed and updated as appropriate: allergies, current medications, past medical history, past social history, past surgical history and problem list.  Objective:  There were no vitals filed for this visit.  Physical Exam Constitutional:      Appearance: Normal appearance.  HENT:     Head: Normocephalic.     Mouth/Throat:     Mouth: Mucous membranes are moist.     Pharynx: Oropharynx is clear. No oropharyngeal exudate or posterior oropharyngeal erythema.  Genitourinary:    Comments: No lice, nits, or pest, no lesions or odor discharge.  Denies pain or tenderness with paplation of testicles.  No lesions, ulcers or masses present.    Musculoskeletal:     Cervical back: Normal range of motion and neck supple.  Lymphadenopathy:     Cervical: No cervical adenopathy.  Skin:    General: Skin is warm and dry.     Findings: No bruising, erythema, lesion or rash.  Neurological:     Mental Status: He is alert and oriented to person, place, and time.  Psychiatric:        Mood and Affect: Mood normal.        Behavior: Behavior normal.       Assessment and Plan:  MESHILEM MACHUCA is a 55 y.o. male presenting to the [facility] Department for STI  screening  1. Screening examination for venereal disease Patient screened for STI today.    - HBV Antigen/Antibody State [facility] - Gonococcus culture - Syphilis Serology, [place] [facility] - HIV/HCV [place] [facility] - Gonococcus culture  Patient does not have STI symptoms Patient accepted all screenings including   oral, urethral GC, gram stain and bloodwork for HIV/RPR/ HCV/ HBV  Patient meets criteria for HepB screening? Yes. Ordered? Yes Patient meets criteria for HepC screening? Yes. Ordered? Yes Recommended condom use with all sex Discussed importance of condom use for STI prevent  Treat gram stain per standing order Discussed time line for State Lab results and that patient will be called with positive results and encouraged patient to call if he had not heard in 2 weeks Recommended returning for continued or worsening symptoms.    Return for as needed.  No future appointments.  Wendi Snipes, FNP

## 2020-03-25 NOTE — Addendum Note (Signed)
Addended by: Lyman Speller on: 03/25/2020 05:00 PM   Modules accepted: Orders

## 2020-03-25 NOTE — Progress Notes (Signed)
Gram stain reviewed and is negative today, so no treatment needed for gram stain per standing order. Awaiting TR's. Counseled pt per provider orders and pt states understanding. Provider orders completed. 

## 2020-03-30 LAB — HEPATITIS B SURFACE ANTIGEN

## 2020-03-31 LAB — GONOCOCCUS CULTURE

## 2020-04-05 ENCOUNTER — Encounter: Payer: Self-pay | Admitting: Student

## 2020-04-05 LAB — HM HIV SCREENING LAB: HM HIV Screening: NEGATIVE

## 2021-02-08 ENCOUNTER — Ambulatory Visit
Admission: EM | Admit: 2021-02-08 | Discharge: 2021-02-08 | Disposition: A | Discharge status: Home / Self Care | Payer: BC Managed Care – PPO | Attending: Emergency Medicine | Admitting: Emergency Medicine

## 2021-02-08 ENCOUNTER — Encounter: Payer: Self-pay | Admitting: Emergency Medicine

## 2021-02-08 ENCOUNTER — Other Ambulatory Visit: Payer: Self-pay

## 2021-02-08 DIAGNOSIS — I10 Essential (primary) hypertension: Secondary | ICD-10-CM

## 2021-02-08 MED ORDER — CHLORTHALIDONE 25 MG PO TABS: 12.5000 mg | ORAL_TABLET | Freq: Every day | ORAL | 0 refills | Status: DC

## 2021-02-08 NOTE — Discharge Instructions (Addendum)
Your EKG did not show any abnormalities and I compared it to a previous EKG from 12 years ago which indicated no change.  Going to start her on chlorthalidone for her blood pressure, take half a tablet daily in the morning to help decrease her blood pressure.  This will cause you to increase urination which will help limit excess fluid from your body so that your blood pressure goes down.  You need to increase your oral fluid intake of water, decrease your salt intake to less than 2000 mg a day, increase her physical activity with a goal of 30 minutes of physical activity 3 days a week, and decrease her alcohol consumption to less than 2 drinks daily.  You need to follow-up with your primary care provider for attenuation of hypertensive management and evaluation of your symptoms.  You may not need to stay on medication if you can follow the dietary measures given.  If you develop a headache, changes in vision, slurred speech, dizziness, or chest pain you need to go to the ER for evaluation.

## 2021-02-08 NOTE — ED Provider Notes (Signed)
[facility]    CSN: [id number] Arrival date & time: 02/08/21  0807      History   Chief Complaint Chief Complaint  Patient presents with   Hypertension    HPI Alexander Deleon is a 55 y.o. male.   HPI  55 year old male here for evaluation of elevated blood pressure.  Patient reports that he was feeling dizzy and weak yesterday so he checked his blood pressure and found it to be 160/100.  He states that it has come down but he is currently still feeling a little on the weak side.  He does have a family history of high blood pressure, he is African-American, his male, and he is a drinker which are for risk factors not in his favor.  He reports that he has been eating a lot of pork lately and that he seizes all of his meat with salt.  He also has poor dietary intake of water and does not exercise currently.  He is a non-smoker.  He states that during the week he has 1-2 beers but on the weekends he does binge, which is an additional risk factor for his high blood pressure.  He denies any headache, change in vision, chest pain, nausea, or vomiting.  Patient does have a primary care doctor but reports its been a number of years since he has had a physical or seen his PCP.  History reviewed. No pertinent past medical history.  Patient Active Problem List   Diagnosis Date Noted   Pneumonia due to COVID-19 virus 07/22/2019   Acute respiratory failure with hypoxia ([place]) 07/22/2019   Alcohol use 07/22/2019    Past Surgical History:  Procedure Laterality Date   COLONOSCOPY WITH PROPOFOL N/A 05/04/2019   Procedure: COLONOSCOPY WITH PROPOFOL;  Surgeon: [name], MD;  Location: [facility];  Service: Gastroenterology;  Laterality: N/A;   KNEE SURGERY Left        Home Medications    Prior to Admission medications   Medication Sig Start Date End Date Taking? Authorizing Provider  chlorthalidone (HYGROTON) 25 MG tablet Take 0.5 tablets (12.5 mg total) by mouth daily.  02/08/21 03/10/21 Yes Margarette Canada, NP  sitaGLIPtin (JANUVIA) 100 MG tablet Take 1 tablet (100 mg total) by mouth daily. 07/27/19 08/26/19  Wyvonnia Dusky, MD    Family History Family History  Problem Relation Age of Onset   Hypertension Mother    Hypertension Father     Social History Social History   Tobacco Use   Smoking status: Never   Smokeless tobacco: Never  Vaping Use   Vaping Use: Never used  Substance Use Topics   Alcohol use: Yes    Alcohol/week: 8.0 standard drinks    Types: 5 Shots of liquor, 3 Cans of beer per week   Drug use: Never     Allergies   Patient has no known allergies.   Review of Systems Review of Systems  Constitutional:  Negative for activity change, appetite change and fever.  Eyes:  Negative for visual disturbance.  Cardiovascular:  Negative for chest pain.  Gastrointestinal:  Negative for nausea and vomiting.  Neurological:  Positive for dizziness and weakness. Negative for syncope, speech difficulty, numbness and headaches.  Psychiatric/Behavioral: Negative.      Physical Exam Triage Vital Signs ED Triage Vitals  Enc Vitals Group     BP      Pulse      Resp      Temp  Temp src      SpO2      Weight      Height      Head Circumference      Peak Flow      Pain Score      Pain Loc      Pain Edu?      Excl. in GC?    No data found.  Updated Vital Signs BP (!) 139/95 (BP Location: Left Arm)   Pulse 80   Temp 99.1 F (37.3 C) (Oral)   Resp 18   Ht 6\' 1"  (1.854 m)   Wt 212 lb 8.4 oz (96.4 kg)   SpO2 99%   BMI 28.04 kg/m   Visual Acuity Right Eye Distance:   Left Eye Distance:   Bilateral Distance:    Right Eye Near:   Left Eye Near:    Bilateral Near:     Physical Exam Vitals and nursing note reviewed.  Constitutional:      General: He is in acute distress.     Appearance: Normal appearance. He is normal weight. He is not ill-appearing.  HENT:     Head: Normocephalic and atraumatic.  Eyes:      General: No scleral icterus.    Extraocular Movements: Extraocular movements intact.     Conjunctiva/sclera: Conjunctivae normal.     Pupils: Pupils are equal, round, and reactive to light.  Cardiovascular:     Rate and Rhythm: Normal rate and regular rhythm.     Pulses: Normal pulses.     Heart sounds: Normal heart sounds. No murmur heard.   No gallop.  Pulmonary:     Effort: Pulmonary effort is normal.     Breath sounds: Normal breath sounds. No wheezing, rhonchi or rales.  Skin:    General: Skin is warm.     Capillary Refill: Capillary refill takes less than 2 seconds.     Findings: No erythema.  Neurological:     General: No focal deficit present.     Mental Status: He is alert and oriented to person, place, and time.     Cranial Nerves: No cranial nerve deficit.     Sensory: No sensory deficit.     Motor: No weakness.  Psychiatric:        Mood and Affect: Mood normal.        Behavior: Behavior normal.        Thought Content: Thought content normal.        Judgment: Judgment normal.     UC Treatments / Results  Labs (all labs ordered are listed, but only abnormal results are displayed) Labs Reviewed - No data to display  EKG Normal sinus rhythm with a ventricular rate of 72 bpm PR interval 170 ms QRS duration 86 ms QT/QTc 374/409 ms No ST or T wave abnormalities.  No changes when compared to EKG from 02/09/2009.   Radiology No results found.  Procedures Procedures (including critical care time)  Medications Ordered in UC Medications - No data to display  Initial Impression / Assessment and Plan / UC Course  I have reviewed the triage vital signs and the nursing notes.  Pertinent labs & imaging results that were available during my care of the patient were reviewed by me and considered in my medical decision making (see chart for details).  Patient is a nontoxic-appearing 55 year old male here for evaluation of elevated blood pressure and dizziness that  started yesterday and has resolved.  He states that he  has had this 1 time in the past but does not have a personal history of high blood pressure.  As stated in the HPI there is a family history of high blood pressure.  His respecters include male gender, African-American ethnicity, family history of hypertension, center lifestyle, poor dietary habits, and binge drinking on the weekends.  He does have a primary care provider but he does not seem his PCP or had a physical in a number of years.  Patient reports that his dizziness symptoms have resolved and his blood pressure has come down but he just feels weak.  He denies any chest pain, headache, change in vision, or nausea.  Patient's physical exam reveals cranial nerves II through XII grossly intact.  Pupils are equal round reactive and EOMs intact.  No carotid bruits when auscultating over the neck and heart sounds are S1-S2 without murmur, rub, or gallop.  Lung sounds are clear to auscultation all fields.  Patient's blood pressure is currently 139/95 and historical data shows 1 additional elevated blood pressure reading of 142/88 2 years ago at an urgent care visit.  As patient symptoms have resolved and his blood pressure has come down the plan is to start patient on chlorthalidone and have him follow-up with his PCP.  Will check EKG to ensure there is no evidence of LVH or cardiac ischemia.  EKG shows no signs of ischemia and there are no changes when compared to the EKG performed on 02/09/2009 that is available in [facility]. Will discharge patient home with diagnosis of hypertension, start him on chlorthalidone, and discussed lifestyle modifications to include elimination of salt from his diet, following a Dash diet program, increasing physical activity, and moderation of his alcohol consumption.   Final Clinical Impressions(s) / UC Diagnoses   Final diagnoses:  Hypertension, unspecified type     Discharge Instructions      Your EKG did not show  any abnormalities and I compared it to a previous EKG from 12 years ago which indicated no change.  Going to start her on chlorthalidone for her blood pressure, take half a tablet daily in the morning to help decrease her blood pressure.  This will cause you to increase urination which will help limit excess fluid from your body so that your blood pressure goes down.  You need to increase your oral fluid intake of water, decrease your salt intake to less than 2000 mg a day, increase her physical activity with a goal of 30 minutes of physical activity 3 days a week, and decrease her alcohol consumption to less than 2 drinks daily.  You need to follow-up with your primary care provider for attenuation of hypertensive management and evaluation of your symptoms.  You may not need to stay on medication if you can follow the dietary measures given.  If you develop a headache, changes in vision, slurred speech, dizziness, or chest pain you need to go to the ER for evaluation.     ED Prescriptions     Medication Sig Dispense Auth. Provider   chlorthalidone (HYGROTON) 25 MG tablet Take 0.5 tablets (12.5 mg total) by mouth daily. 15 tablet Margarette Canada, NP      PDMP not reviewed this encounter.   Margarette Canada, NP 02/08/21 [phone]

## 2021-02-08 NOTE — ED Triage Notes (Signed)
Pt states his BP has been running high yesterday. He states he felt dizzy and checked his BP and was elevated 160/100's. He states it came down some yesterday. He does not currently take anything for BP. He states he has been eating a lot of pork over the last couple of weeks.

## 2021-02-14 ENCOUNTER — Ambulatory Visit
Admission: EM | Admit: 2021-02-14 | Discharge: 2021-02-14 | Disposition: A | Discharge status: Home / Self Care | Payer: BC Managed Care – PPO | Attending: Emergency Medicine | Admitting: Emergency Medicine

## 2021-02-14 ENCOUNTER — Other Ambulatory Visit: Payer: Self-pay

## 2021-02-14 DIAGNOSIS — E1159 Type 2 diabetes mellitus with other circulatory complications: Secondary | ICD-10-CM

## 2021-02-14 DIAGNOSIS — R944 Abnormal results of kidney function studies: Secondary | ICD-10-CM

## 2021-02-14 DIAGNOSIS — E1129 Type 2 diabetes mellitus with other diabetic kidney complication: Secondary | ICD-10-CM | POA: Insufficient documentation

## 2021-02-14 DIAGNOSIS — I10 Essential (primary) hypertension: Secondary | ICD-10-CM | POA: Diagnosis present

## 2021-02-14 DIAGNOSIS — Z7984 Long term (current) use of oral hypoglycemic drugs: Secondary | ICD-10-CM

## 2021-02-14 LAB — CBC WITH DIFFERENTIAL/PLATELET
Abs Immature Granulocytes: 0.01 10*3/uL (ref 0.00–0.07)
Basophils Absolute: 0 10*3/uL (ref 0.0–0.1)
Basophils Relative: 1 %
Eosinophils Absolute: 0.1 10*3/uL (ref 0.0–0.5)
Eosinophils Relative: 1 %
HCT: 50.7 % (ref 39.0–52.0)
Hemoglobin: 16.3 g/dL (ref 13.0–17.0)
Immature Granulocytes: 0 %
Lymphocytes Relative: 35 %
Lymphs Abs: 2 10*3/uL (ref 0.7–4.0)
MCH: 27.9 pg (ref 26.0–34.0)
MCHC: 32.1 g/dL (ref 30.0–36.0)
MCV: 86.8 fL (ref 80.0–100.0)
Monocytes Absolute: 0.6 10*3/uL (ref 0.1–1.0)
Monocytes Relative: 10 %
Neutro Abs: 3.1 10*3/uL (ref 1.7–7.7)
Neutrophils Relative %: 53 %
Platelets: 226 10*3/uL (ref 150–400)
RBC: 5.84 MIL/uL — ABNORMAL HIGH (ref 4.22–5.81)
RDW: 13.8 % (ref 11.5–15.5)
WBC: 5.7 10*3/uL (ref 4.0–10.5)
nRBC: 0 % (ref 0.0–0.2)

## 2021-02-14 LAB — COMPREHENSIVE METABOLIC PANEL
ALT: 26 U/L (ref 0–44)
AST: 23 U/L (ref 15–41)
Albumin: 5 g/dL (ref 3.5–5.0)
Alkaline Phosphatase: 75 U/L (ref 38–126)
Anion gap: 13 (ref 5–15)
BUN: 27 mg/dL — ABNORMAL HIGH (ref 6–20)
CO2: 28 mmol/L (ref 22–32)
Calcium: 10.5 mg/dL — ABNORMAL HIGH (ref 8.9–10.3)
Chloride: 93 mmol/L — ABNORMAL LOW (ref 98–111)
Creatinine, Ser: 1.43 mg/dL — ABNORMAL HIGH (ref 0.61–1.24)
GFR, Estimated: 57 mL/min — ABNORMAL LOW (ref >60)
Glucose, Bld: 131 mg/dL — ABNORMAL HIGH (ref 70–99)
Potassium: 3.8 mmol/L (ref 3.5–5.1)
Sodium: 134 mmol/L — ABNORMAL LOW (ref 135–145)
Total Bilirubin: 0.9 mg/dL (ref 0.3–1.2)
Total Protein: 8.9 g/dL — ABNORMAL HIGH (ref 6.5–8.1)

## 2021-02-14 LAB — HEMOGLOBIN A1C
Hgb A1c MFr Bld: 7.1 % — ABNORMAL HIGH (ref 4.8–5.6)
Mean Plasma Glucose: 157 mg/dL

## 2021-02-14 MED ORDER — CHLORTHALIDONE 25 MG PO TABS: 12.5000 mg | ORAL_TABLET | Freq: Every day | ORAL | 2 refills | Status: DC

## 2021-02-14 MED ORDER — METFORMIN HCL 500 MG PO TABS
1000.0000 mg | ORAL_TABLET | Freq: Two times a day (BID) | ORAL | 2 refills | Status: DC
Start: 2021-02-14 — End: 2021-11-24

## 2021-02-14 MED ORDER — IRBESARTAN 150 MG PO TABS: 150.0000 mg | ORAL_TABLET | Freq: Every day | ORAL | 2 refills | Status: AC

## 2021-02-14 NOTE — ED Provider Notes (Signed)
[facility]    CSN: [id number] Arrival date & time: 02/14/21  [id number]      History   Chief Complaint Chief Complaint  Patient presents with   Hypertension    HPI Alexander Deleon is a 55 y.o. male.   HPI  55 year old male here for evaluation of high blood pressure.  Patient was evaluated in this urgent care on 02/08/2021 for elevated blood pressure that was associated with dizziness and the feeling of being weak.  At that time he denied headache, changes in vision, chest pain, nausea, or vomiting.  Patient states he has a primary care doctor and is typically seen at the health department.  Today he is reporting occipital headache, which she says he has had for a week, and dizziness.  The dizziness started yesterday and increased today.  He still denies changes in vision, chest pain, or shortness of breath.  He was started on chlorthalidone and states he has been taking it daily.  Today he felt dizzy at work and he went to the onsite nurse for his blood pressure to be checked.  They performed 3 readings with the first reading being 139/108 and a heart rate of 121, second reading 154/100 with a heart rate of 114, and the third reading was 154/112.  He also had a fingerstick blood sugar checked at that time which was 134.  Patient is return to the urgent care for reevaluation.  History reviewed. No pertinent past medical history.  Patient Active Problem List   Diagnosis Date Noted   Pneumonia due to COVID-19 virus 07/22/2019   Acute respiratory failure with hypoxia ([place]) 07/22/2019   Alcohol use 07/22/2019    Past Surgical History:  Procedure Laterality Date   COLONOSCOPY WITH PROPOFOL N/A 05/04/2019   Procedure: COLONOSCOPY WITH PROPOFOL;  Surgeon: [name], MD;  Location: [facility] ENDOSCOPY;  Service: Gastroenterology;  Laterality: N/A;   KNEE SURGERY Left        Home Medications    Prior to Admission medications   Medication Sig Start Date End Date Taking?  Authorizing Provider  irbesartan (AVAPRO) 150 MG tablet Take 1 tablet (150 mg total) by mouth daily. 02/14/21  Yes Margarette Canada, NP  metFORMIN (GLUCOPHAGE) 500 MG tablet Take 2 tablets (1,000 mg total) by mouth 2 (two) times daily with a meal. 02/14/21 03/16/21 Yes Margarette Canada, NP  chlorthalidone (HYGROTON) 25 MG tablet Take 0.5 tablets (12.5 mg total) by mouth daily. 02/14/21 03/16/21  Margarette Canada, NP  sitaGLIPtin (JANUVIA) 100 MG tablet Take 1 tablet (100 mg total) by mouth daily. 07/27/19 08/26/19  Wyvonnia Dusky, MD    Family History Family History  Problem Relation Age of Onset   Hypertension Mother    Hypertension Father     Social History Social History   Tobacco Use   Smoking status: Never   Smokeless tobacco: Never  Vaping Use   Vaping Use: Never used  Substance Use Topics   Alcohol use: Yes    Alcohol/week: 8.0 standard drinks    Types: 5 Shots of liquor, 3 Cans of beer per week   Drug use: Never     Allergies   Patient has no known allergies.   Review of Systems Review of Systems  Constitutional:  Negative for activity change, appetite change and fatigue.  Eyes:  Negative for visual disturbance.  Respiratory:  Negative for shortness of breath.   Cardiovascular:  Negative for chest pain, palpitations and leg swelling.  Neurological:  Positive for  dizziness and headaches.  Hematological: Negative.   Psychiatric/Behavioral: Negative.      Physical Exam Triage Vital Signs ED Triage Vitals  Enc Vitals Group     BP 02/14/21 0851 (!) 142/99     Pulse Rate 02/14/21 0851 82     Resp 02/14/21 0851 18     Temp 02/14/21 0851 98.4 F (36.9 C)     Temp Source 02/14/21 0851 Oral     SpO2 02/14/21 0851 98 %     Weight 02/14/21 0850 218 lb (98.9 kg)     Height 02/14/21 0850 6\' 1"  (1.854 m)     Head Circumference --      Peak Flow --      Pain Score 02/14/21 0849 5     Pain Loc --      Pain Edu? --      Excl. in GC? --    No data found.  Updated Vital  Signs BP (!) 142/99 (BP Location: Left Arm)   Pulse 82   Temp 98.4 F (36.9 C) (Oral)   Resp 18   Ht 6\' 1"  (1.854 m)   Wt 218 lb (98.9 kg)   SpO2 98%   BMI 28.76 kg/m   Visual Acuity Right Eye Distance:   Left Eye Distance:   Bilateral Distance:    Right Eye Near:   Left Eye Near:    Bilateral Near:     Physical Exam Vitals and nursing note reviewed.  Constitutional:      General: He is not in acute distress.    Appearance: Normal appearance. He is not ill-appearing.  HENT:     Head: Normocephalic and atraumatic.     Mouth/Throat:     Mouth: Mucous membranes are moist.     Pharynx: Oropharynx is clear. No posterior oropharyngeal erythema.  Eyes:     Extraocular Movements: Extraocular movements intact.     Pupils: Pupils are equal, round, and reactive to light.  Cardiovascular:     Rate and Rhythm: Normal rate and regular rhythm.     Pulses: Normal pulses.     Heart sounds: Normal heart sounds. No murmur heard.   No gallop.  Pulmonary:     Effort: Pulmonary effort is normal.     Breath sounds: Normal breath sounds. No wheezing, rhonchi or rales.  Musculoskeletal:     Cervical back: Normal range of motion and neck supple.  Lymphadenopathy:     Cervical: No cervical adenopathy.  Skin:    General: Skin is warm and dry.     Capillary Refill: Capillary refill takes less than 2 seconds.  Neurological:     General: No focal deficit present.     Mental Status: He is alert and oriented to person, place, and time.     Motor: No weakness.  Psychiatric:        Mood and Affect: Mood normal.        Behavior: Behavior normal.        Thought Content: Thought content normal.        Judgment: Judgment normal.     UC Treatments / Results  Labs (all labs ordered are listed, but only abnormal results are displayed) Labs Reviewed  CBC WITH DIFFERENTIAL/PLATELET - Abnormal; Notable for the following components:      Result Value   RBC 5.84 (*)    All other components  within normal limits  COMPREHENSIVE METABOLIC PANEL - Abnormal; Notable for the following components:   Sodium 134 (*)  Chloride 93 (*)    Glucose, Bld 131 (*)    BUN 27 (*)    Creatinine, Ser 1.43 (*)    Calcium 10.5 (*)    Total Protein 8.9 (*)    GFR, Estimated 57 (*)    All other components within normal limits  HEMOGLOBIN A1C    EKG   Radiology No results found.  Procedures Procedures (including critical care time)  Medications Ordered in UC Medications - No data to display  Initial Impression / Assessment and Plan / UC Course  I have reviewed the triage vital signs and the nursing notes.  Pertinent labs & imaging results that were available during my care of the patient were reviewed by me and considered in my medical decision making (see chart for details).  Patient is a nontoxic appearing 55 year old African-American male here for reevaluation of continued high blood pressure despite taking chlorthalidone as directed at his last visit 6 days ago.  He states he has not made a follow-up appointment with his primary care provider at the health department.  Patient states that he has been experiencing an occipital headache for the past week and dizziness for last 2 days.  Despite our past discussion patient reports that he has only consumed about 6 bottles of water in total over the last 2 days.  Advised him last time that he was being started on a diuretic and he needed to take an adequate oral fluid to keep from becoming dizzy.  Part of his elevated heart rate may be secondary to dehydration.  Patient's eyes are dull and his oral mucous membranes are sticky.  Patient has normal skin turgor.  Cardiopulmonary exam reveals S1-S2 heart sounds without murmur, rub, or ectopy.  Lung sounds clear to auscultation all fields.  Patient's last blood work was performed on 07/27/2019 and at that time he had a blood sugar of 285, an elevated ferritin level of 593, BUN of 30, and a sodium of  132.  On 07/25/2019 he had an A1c of 7.9.  Patient was started on Januvia but only given a 30-day supply with no refills.  He took the medicine but has not taken anything for his diabetes since.  He states that he is monitoring his blood sugar at home with a glucose meter.  At his last visit we also discussed diet modifications and alcohol regulation which he reports she has done.  We will check CBC, CMP, and A1c today.  If there is no abnormalities to his renal function plan is to start him on olmesartan 20 mg daily.  CMP shows sodium of 134, glucose of 131, BUN 27, creatinine 1.43, calcium 10.5, GFR 57. This is an interval change from 07/27/2019 where that time patient's BUN was 30 and his creatinine was 0.94.His glucose at the time was 285 as well.  CBC shows a mildly elevated RBC count but is otherwise unremarkable.  Hemoglobin A1c is still pending.  Patient's calculated creatinine clearance is 80 mL/min.  Will start patient on irbesartan 150 mg daily and have him continue the chlorthalidone as there is no need to adjust dosing based on his current calculated creatinine clearance.  I will also start patient on metformin twice daily for control of his diabetes.   Final Clinical Impressions(s) / UC Diagnoses   Final diagnoses:  Essential hypertension  Type 2 diabetes mellitus with other diabetic kidney complication, without long-term current use of insulin [facility])     Discharge Instructions  Your blood work today showed that your kidney function is impaired and this is most likely secondary to diabetes and high blood pressure.  Also dehydration can be playing a role.  When should continue your chlorthalidone 12.5 mg daily for your high blood pressure.  Start taking the irbesartan 150 mg daily this morning for your high blood pressure.  I am going to restart you on medicine for your blood sugar and we will start you on metformin start with 500 mg twice daily for the first week.  If you  are tolerating this then after 1 week you can start taking 1000 g in the morning and 5 mg at night.  If you are tolerating this then after 3 weeks you can start taking 1000 mg in the morning and 1000 mg at night.  Metformin does have the potential to give you diarrhea, especially if you eat a lot of carbohydrates.  This should improve over time and with diet modifications.  You need to increase your oral water intake to at least 64 ounces a day.  If you can get closer to a gallon a day that would be more preferable.  You need to make an appointment to see your primary care physician within the next 2 weeks.  We cannot continue to manage her hypertension and diabetes here at the urgent care as we are not a primary care practice.  If you are unable to get an appointment at the health department to be seen you can also try [facility] in [place].     ED Prescriptions     Medication Sig Dispense Auth. Provider   chlorthalidone (HYGROTON) 25 MG tablet Take 0.5 tablets (12.5 mg total) by mouth daily. 15 tablet Margarette Canada, NP   irbesartan (AVAPRO) 150 MG tablet Take 1 tablet (150 mg total) by mouth daily. 30 tablet Margarette Canada, NP   metFORMIN (GLUCOPHAGE) 500 MG tablet Take 2 tablets (1,000 mg total) by mouth 2 (two) times daily with a meal. 120 tablet Margarette Canada, NP      PDMP not reviewed this encounter.   Margarette Canada, NP 02/14/21 1059

## 2021-02-14 NOTE — ED Triage Notes (Signed)
Pt c/o high blood pressure. Pt states that he has taken his blood pressure today at work and it was 139/108, 154/100, and 154/112. Pt states that he was seen on here 02/08/21 for high blood pressure. Pt states that he has been taking the Hygroton prescribed during his last visit. Pt states that he has only felt dizzy starting on 02/13/21 and has not been able to check his blood pressure at home. Pt has taken his medication today at 5am.

## 2021-02-14 NOTE — Discharge Instructions (Addendum)
Your blood work today showed that your kidney function is impaired and this is most likely secondary to diabetes and high blood pressure.  Also dehydration can be playing a role.  When should continue your chlorthalidone 12.5 mg daily for your high blood pressure.  Start taking the irbesartan 150 mg daily this morning for your high blood pressure.  I am going to restart you on medicine for your blood sugar and we will start you on metformin start with 500 mg twice daily for the first week.  If you are tolerating this then after 1 week you can start taking 1000 g in the morning and 5 mg at night.  If you are tolerating this then after 3 weeks you can start taking 1000 mg in the morning and 1000 mg at night.  Metformin does have the potential to give you diarrhea, especially if you eat a lot of carbohydrates.  This should improve over time and with diet modifications.  You need to increase your oral water intake to at least 64 ounces a day.  If you can get closer to a gallon a day that would be more preferable.  You need to make an appointment to see your primary care physician within the next 2 weeks.  We cannot continue to manage her hypertension and diabetes here at the urgent care as we are not a primary care practice.  If you are unable to get an appointment at the health department to be seen you can also try [facility] in [place].

## 2021-02-15 ENCOUNTER — Emergency Department: Payer: BC Managed Care – PPO

## 2021-02-15 ENCOUNTER — Encounter: Payer: Self-pay | Admitting: Emergency Medicine

## 2021-02-15 ENCOUNTER — Other Ambulatory Visit: Payer: Self-pay

## 2021-02-15 ENCOUNTER — Emergency Department
Admission: EM | Admit: 2021-02-15 | Discharge: 2021-02-15 | Disposition: A | Discharge status: Home / Self Care | Payer: BC Managed Care – PPO | Attending: Student in an Organized Health Care Education/Training Program | Admitting: Student in an Organized Health Care Education/Training Program

## 2021-02-15 DIAGNOSIS — R42 Dizziness and giddiness: Secondary | ICD-10-CM | POA: Diagnosis present

## 2021-02-15 DIAGNOSIS — E860 Dehydration: Secondary | ICD-10-CM | POA: Insufficient documentation

## 2021-02-15 DIAGNOSIS — Z8616 Personal history of COVID-19: Secondary | ICD-10-CM | POA: Insufficient documentation

## 2021-02-15 DIAGNOSIS — E119 Type 2 diabetes mellitus without complications: Secondary | ICD-10-CM | POA: Insufficient documentation

## 2021-02-15 DIAGNOSIS — Z7984 Long term (current) use of oral hypoglycemic drugs: Secondary | ICD-10-CM | POA: Diagnosis not present

## 2021-02-15 DIAGNOSIS — I10 Essential (primary) hypertension: Secondary | ICD-10-CM | POA: Insufficient documentation

## 2021-02-15 HISTORY — DX: Essential (primary) hypertension: I10

## 2021-02-15 HISTORY — DX: Type 2 diabetes mellitus without complications: E11.9

## 2021-02-15 LAB — URINALYSIS, ROUTINE W REFLEX MICROSCOPIC
Bilirubin Urine: NEGATIVE
Glucose, UA: NEGATIVE mg/dL
Hgb urine dipstick: NEGATIVE
Ketones, ur: NEGATIVE mg/dL
Leukocytes,Ua: NEGATIVE
Nitrite: NEGATIVE
Protein, ur: NEGATIVE mg/dL
Specific Gravity, Urine: 1.025 (ref 1.005–1.030)
pH: 5.5 (ref 5.0–8.0)

## 2021-02-15 LAB — BASIC METABOLIC PANEL WITH GFR
Anion gap: 8 (ref 5–15)
BUN: 29 mg/dL — ABNORMAL HIGH (ref 6–20)
CO2: 27 mmol/L (ref 22–32)
Calcium: 9.8 mg/dL (ref 8.9–10.3)
Chloride: 95 mmol/L — ABNORMAL LOW (ref 98–111)
Creatinine, Ser: 1.49 mg/dL — ABNORMAL HIGH (ref 0.61–1.24)
GFR, Estimated: 54 mL/min — ABNORMAL LOW
Glucose, Bld: 176 mg/dL — ABNORMAL HIGH (ref 70–99)
Potassium: 3.3 mmol/L — ABNORMAL LOW (ref 3.5–5.1)
Sodium: 130 mmol/L — ABNORMAL LOW (ref 135–145)

## 2021-02-15 LAB — CBC
HCT: 47 % (ref 39.0–52.0)
Hemoglobin: 15.7 g/dL (ref 13.0–17.0)
MCH: 28.3 pg (ref 26.0–34.0)
MCHC: 33.4 g/dL (ref 30.0–36.0)
MCV: 84.8 fL (ref 80.0–100.0)
Platelets: 235 10*3/uL (ref 150–400)
RBC: 5.54 MIL/uL (ref 4.22–5.81)
RDW: 13.3 % (ref 11.5–15.5)
WBC: 5.6 10*3/uL (ref 4.0–10.5)
nRBC: 0 % (ref 0.0–0.2)

## 2021-02-15 NOTE — ED Provider Notes (Signed)
Alexander Deleon    Event Date/Time   First MD Initiated Contact with Patient 02/15/21 [phone]     (approximate)  I have reviewed the triage vital signs and the nursing notes.   HISTORY  Chief Complaint Dizziness    HPI Alexander Deleon is a 55 y.o. male with a history of hypertension diabetes presents to the ER for concern for intermittent episodes of dizziness particularly when standing up and while at work.  States the symptoms last only a few moments.  Does not lose consciousness.  Feels lightheaded and like he is about to pass out denies any chest pain or pressure.  Was seen by urgent care twice over the past few weeks was recently started back on high blood pressure medication including started on Hygroton and metformin.  Has not followed up with PCP.  Has taken those medications.  He denies any chest pain or shortness of breath.  No nausea or vomiting.  No blurry vision.  No numbness or tingling.  Past Medical History:  Diagnosis Date   Diabetes mellitus without complication (HCC)    Hypertension    Family History  Problem Relation Age of Onset   Hypertension Mother    Hypertension Father    Past Surgical History:  Procedure Laterality Date   COLONOSCOPY WITH PROPOFOL N/A 05/04/2019   Procedure: COLONOSCOPY WITH PROPOFOL;  Surgeon: Wyline Mood, MD;  Location: [facility] ENDOSCOPY;  Service: Gastroenterology;  Laterality: N/A;   KNEE SURGERY Left    Patient Active Problem List   Diagnosis Date Noted   Pneumonia due to COVID-19 virus 07/22/2019   Acute respiratory failure with hypoxia ([facility]) 07/22/2019   Alcohol use 07/22/2019      Prior to Admission medications   Medication Sig Start Date End Date Taking? Authorizing Provider  irbesartan (AVAPRO) 150 MG tablet Take 1 tablet (150 mg total) by mouth daily. 02/14/21   Becky Augusta, NP  metFORMIN (GLUCOPHAGE) 500 MG tablet Take 2 tablets (1,000 mg total) by mouth 2 (two)  times daily with a meal. 02/14/21 03/16/21  Becky Augusta, NP    Allergies Patient has no known allergies.    Social History Social History   Tobacco Use   Smoking status: Never   Smokeless tobacco: Never  Vaping Use   Vaping Use: Never used  Substance Use Topics   Alcohol use: Yes    Alcohol/week: 8.0 standard drinks    Types: 5 Shots of liquor, 3 Cans of beer per week   Drug use: Never    Review of Systems Patient denies headaches, rhinorrhea, blurry vision, numbness, shortness of breath, chest pain, edema, cough, abdominal pain, nausea, vomiting, diarrhea, dysuria, fevers, rashes or hallucinations unless otherwise stated above in HPI. ____________________________________________   PHYSICAL EXAM:  VITAL SIGNS: Vitals:   02/15/21 0854 02/15/21 0859  BP: (!) 120/91   Pulse: 79   Resp: 16   Temp:  97.6 F (36.4 C)  SpO2: 97%     Constitutional: Alert and oriented.  Eyes: Conjunctivae are normal.  Head: Atraumatic. Nose: No congestion/rhinnorhea. Mouth/Throat: Mucous membranes are moist.   Neck: No stridor. Painless ROM.  Cardiovascular: Normal rate, regular rhythm. Grossly normal heart sounds.  Good peripheral circulation. Respiratory: Normal respiratory effort.  No retractions. Lungs CTAB. Gastrointestinal: Soft and nontender. No distention. No abdominal bruits. No CVA tenderness. Genitourinary:  Musculoskeletal: No lower extremity tenderness nor edema.  No joint effusions. Neurologic:  CN- intact.  No facial droop, Normal FNF.  Normal heel to shin.  Sensation intact bilaterally. Normal speech and language. No gross focal neurologic deficits are appreciated. No gait instability.  Benign hints exam Skin:  Skin is warm, dry and intact. No rash noted. Psychiatric: Mood and affect are normal. Speech and behavior are normal.  ____________________________________________   LABS (all labs ordered are listed, but only abnormal results are displayed)  Results for  orders placed or performed during the hospital encounter of 02/15/21 (from the past 24 hour(s))  Basic metabolic panel     Status: Abnormal   Collection Time: 02/15/21  6:40 AM  Result Value Ref Range   Sodium 130 (L) 135 - 145 mmol/L   Potassium 3.3 (L) 3.5 - 5.1 mmol/L   Chloride 95 (L) 98 - 111 mmol/L   CO2 27 22 - 32 mmol/L   Glucose, Bld 176 (H) 70 - 99 mg/dL   BUN 29 (H) 6 - 20 mg/dL   Creatinine, Ser 1.02 (H) 0.61 - 1.24 mg/dL   Calcium 9.8 8.9 - 72.5 mg/dL   GFR, Estimated 54 (L) >60 mL/min   Anion gap 8 5 - 15  CBC     Status: None   Collection Time: 02/15/21  6:40 AM  Result Value Ref Range   WBC 5.6 4.0 - 10.5 K/uL   RBC 5.54 4.22 - 5.81 MIL/uL   Hemoglobin 15.7 13.0 - 17.0 g/dL   HCT 36.6 44.0 - 34.7 %   MCV 84.8 80.0 - 100.0 fL   MCH 28.3 26.0 - 34.0 pg   MCHC 33.4 30.0 - 36.0 g/dL   RDW 42.5 95.6 - 38.7 %   Platelets 235 150 - 400 K/uL   nRBC 0.0 0.0 - 0.2 %  Urinalysis, Routine w reflex microscopic     Status: None   Collection Time: 02/15/21  8:23 AM  Result Value Ref Range   Color, Urine YELLOW YELLOW   APPearance CLEAR CLEAR   Specific Gravity, Urine 1.025 1.005 - 1.030   pH 5.5 5.0 - 8.0   Glucose, UA NEGATIVE NEGATIVE mg/dL   Hgb urine dipstick NEGATIVE NEGATIVE   Bilirubin Urine NEGATIVE NEGATIVE   Ketones, ur NEGATIVE NEGATIVE mg/dL   Protein, ur NEGATIVE NEGATIVE mg/dL   Nitrite NEGATIVE NEGATIVE   Leukocytes,Ua NEGATIVE NEGATIVE   ____________________________________________  EKG My review and personal interpretation at Time: 6:36   Indication: dizziness  Rate: 85  Rhythm: sinus Axis: normal Other: normal intervals, no stemi,  ____________________________________________  RADIOLOGY  I personally reviewed all radiographic images ordered to evaluate for the above acute complaints and reviewed radiology reports and findings.  These findings were personally discussed with the patient.  Please see medical record for radiology  report.  ____________________________________________   PROCEDURES  Procedure(s) performed:  Procedures    Critical Care performed: no ____________________________________________   INITIAL IMPRESSION / ASSESSMENT AND PLAN / ED COURSE  Pertinent labs & imaging results that were available during my care of the patient were reviewed by me and considered in my medical decision making (see chart for details).   DDX: dehydration, htnive urgency, mass, tia, cva, dysrhythmia, anemia, bppv, orthostasis  Alexander Deleon is a 55 y.o. who presents to the ED with symptoms as described above he is currently afebrile hemodynamically stable no hypertension.  Clinically very well-appearing.  No focal neurodeficits.  Have a lower suspicion for TIA CVA or mass but given his age history of hypertension CT imaging will be ordered to evaluate for intracranial abnormality.  Have very  low suspicion for aneurysm or SAH.  No sign of infectious process.  EKG without any sign of preexcitation syndrome or dysrhythmia nonischemic appearing he is denying any chest pain or pressure to suggest ACS or dissection.  Does have borderline hyponatremia as compared to previous results some chronic renal insufficiency.  States that he has not been drinking much fluid despite being told that he was dehydrated and also placed on antihypertensive medications at the same time.  Will order orthostatic vital signs, CT imaging and reassess.  Imaging reassuring.  Patient not symptomatic orthostatic at this time his blood pressure is downtrending after taking his blood pressure medications this morning.  He is clinically well-appearing I do believe he stable for further work-up as an outpatient but most importantly referral to PCP for further management of his blood pressure.  I did advise the patient to discontinue his hygroton and increase p.o. intake follow-up with PCP for repeat blood work and repeat blood pressure check.  Patient  agreeable to plan.  The patient was evaluated in Emergency Department today for the symptoms described in the history of present illness. He/she was evaluated in the context of the global COVID-19 pandemic, which necessitated consideration that the patient might be at risk for infection with the SARS-CoV-2 virus that causes COVID-19. Institutional protocols and algorithms that pertain to the evaluation of patients at risk for COVID-19 are in a state of rapid change based on information released by regulatory bodies including the CDC and federal and state organizations. These policies and algorithms were followed during the patient's care in the ED.  As part of my medical decision making, I reviewed the following data within the electronic MEDICAL RECORD NUMBER Nursing notes reviewed and incorporated, Labs reviewed, notes from prior ED visits and  Controlled Substance Database   ____________________________________________   FINAL CLINICAL IMPRESSION(S) / ED DIAGNOSES  Final diagnoses:  Lightheadedness  Dehydration      NEW MEDICATIONS STARTED DURING THIS VISIT:  Discharge Medication List as of 02/15/2021  9:06 AM       Deleon:  This document was prepared using Dragon voice recognition software and may include unintentional dictation errors.    Willy Eddy, MD 02/15/21 1255

## 2021-02-15 NOTE — ED Notes (Signed)
Patient transported to CT 

## 2021-02-15 NOTE — Discharge Instructions (Addendum)
Please discontinue your chlorthalidone.  Drink plenty of fluids.  Follow up with PCP.

## 2021-02-15 NOTE — ED Triage Notes (Signed)
Pt arrived via POV with reports of dizziness while at work, pt states he was seen yesterday at [facility] and was prescribed new medications for diabetes and BP, pt states his BP was high at UC.   Pt is alert and oriented x4, pt c/o headache and dizziness at this time. Pt states the pain in his head is worse when he moves his neck 5/10 pain.

## 2021-04-27 ENCOUNTER — Emergency Department
Admission: EM | Admit: 2021-04-27 | Discharge: 2021-04-27 | Disposition: A | Discharge status: Home / Self Care | Payer: BC Managed Care – PPO | Attending: Emergency Medicine | Admitting: Emergency Medicine

## 2021-04-27 DIAGNOSIS — E119 Type 2 diabetes mellitus without complications: Secondary | ICD-10-CM | POA: Insufficient documentation

## 2021-04-27 DIAGNOSIS — Z7984 Long term (current) use of oral hypoglycemic drugs: Secondary | ICD-10-CM | POA: Diagnosis not present

## 2021-04-27 DIAGNOSIS — J029 Acute pharyngitis, unspecified: Secondary | ICD-10-CM | POA: Diagnosis not present

## 2021-04-27 DIAGNOSIS — Z20822 Contact with and (suspected) exposure to covid-19: Secondary | ICD-10-CM | POA: Diagnosis not present

## 2021-04-27 DIAGNOSIS — I10 Essential (primary) hypertension: Secondary | ICD-10-CM | POA: Diagnosis not present

## 2021-04-27 DIAGNOSIS — Z79899 Other long term (current) drug therapy: Secondary | ICD-10-CM | POA: Insufficient documentation

## 2021-04-27 DIAGNOSIS — R070 Pain in throat: Secondary | ICD-10-CM | POA: Diagnosis present

## 2021-04-27 LAB — RESP PANEL BY RT-PCR (FLU A&B, COVID) ARPGX2
Influenza A by PCR: NEGATIVE
Influenza B by PCR: NEGATIVE
SARS Coronavirus 2 by RT PCR: NEGATIVE

## 2021-04-27 LAB — MONONUCLEOSIS SCREEN: Mono Screen: NEGATIVE

## 2021-04-27 LAB — GROUP A STREP BY PCR: Group A Strep by PCR: NOT DETECTED

## 2021-04-27 NOTE — ED Provider Notes (Signed)
[facility] Provider Note    Event Date/Time   First MD Initiated Contact with Patient 04/27/21 [phone]     (approximate)   History   Sore Throat   HPI  Alexander Deleon is a 56 y.o. male with history of hypertension and non-insulin-dependent diabetes who presents to the emergency department with 1 day of "scratchy" throat.  No difficulty swallowing, speaking or breathing.  States he is coughing up some phlegm from his throat.  No fevers.  No vomiting or diarrhea.  Had a negative COVID test at home yesterday.   History provided by patient.  Please see downtime documentation for further information.  Past Medical History:  Diagnosis Date   Diabetes mellitus without complication (HCC)    Hypertension     Past Surgical History:  Procedure Laterality Date   COLONOSCOPY WITH PROPOFOL N/A 05/04/2019   Procedure: COLONOSCOPY WITH PROPOFOL;  Surgeon: Wyline Mood, MD;  Location: [facility] ENDOSCOPY;  Service: Gastroenterology;  Laterality: N/A;   KNEE SURGERY Left     MEDICATIONS:  Prior to Admission medications   Medication Sig Start Date End Date Taking? Authorizing Provider  irbesartan (AVAPRO) 150 MG tablet Take 1 tablet (150 mg total) by mouth daily. 02/14/21   Becky Augusta, NP  metFORMIN (GLUCOPHAGE) 500 MG tablet Take 2 tablets (1,000 mg total) by mouth 2 (two) times daily with a meal. 02/14/21 03/16/21  Becky Augusta, NP    Physical Exam   Triage Vital Signs: ED Triage Vitals  Enc Vitals Group     BP      Pulse      Resp      Temp      Temp src      SpO2      Weight      Height      Head Circumference      Peak Flow      Pain Score      Pain Loc      Pain Edu?      Excl. in GC?     Most recent vital signs: There were no vitals filed for this visit. (See downtime documentation)  CONSTITUTIONAL: Alert and oriented and responds appropriately to questions. Well-appearing; well-nourished HEAD: Normocephalic, atraumatic EYES: Conjunctivae  clear, pupils appear equal, sclera nonicteric ENT: normal nose; moist mucous membranes; No pharyngeal erythema or petechiae, mild bilateral tonsillar hypertrophy but no exudate, no uvular deviation, no unilateral swelling in posterior oropharynx, no trismus or drooling, no muffled voice, normal phonation, no stridor, airway patent. NECK: Supple, normal ROM, no cervical lymphadenopathy, no meningismus CARD: RRR; S1 and S2 appreciated; no murmurs, no clicks, no rubs, no gallops RESP: Normal chest excursion without splinting or tachypnea; breath sounds clear and equal bilaterally; no wheezes, no rhonchi, no rales, no hypoxia or respiratory distress, speaking full sentences ABD/GI: Normal bowel sounds; non-distended; soft, non-tender, no rebound, no guarding, no peritoneal signs BACK: The back appears normal EXT: Normal ROM in all joints; no deformity noted, no edema; no cyanosis SKIN: Normal color for age and race; warm; no rash on exposed skin NEURO: Moves all extremities equally, normal speech PSYCH: The patient's mood and manner are appropriate.   ED Results / Procedures / Treatments   LABS: (all labs ordered are listed, but only abnormal results are displayed) Labs Reviewed - No data to display   EKG:   RADIOLOGY: My personal review and interpretation of imaging:    I have personally reviewed all radiology reports.  No results found.   PROCEDURES:  Critical Care performed: No      Procedures    IMPRESSION / MDM / ASSESSMENT AND PLAN / ED COURSE  I reviewed the triage vital signs and the nursing notes.    Patient here with sore throat x1 day.     DIFFERENTIAL DIAGNOSIS (includes but not limited to):   Viral pharyngitis, strep pharyngitis, COVID, influenza.  Doubt pneumonia, meningitis, uvulitis, deep space neck infection, PTA, dental infection.   PLAN: We will obtain COVID, flu swabs, strep swab and Monospot.  Will give ibuprofen, Decadron for symptomatic  relief.   MEDICATIONS GIVEN IN ED: Ibuprofen and Decadron   ED COURSE: Patient's COVID, flu, strep and mono tests have come back negative.  Suspect viral pharyngitis.  I do not feel he needs antibiotics.  He is well-appearing, tolerating secretions with no difficulty breathing, normal phonation.  I feel he is safe for discharge.  Recommended alternating Tylenol, ibuprofen over-the-counter for pain control.  At this time, I do not feel there is any life-threatening condition present. I reviewed all nursing notes, vitals, pertinent previous records.  All lab and urine results, EKGs, imaging ordered have been independently reviewed and interpreted by myself.  I reviewed all available radiology reports from any imaging ordered this visit.  Based on my assessment, I feel the patient is safe to be discharged home without further emergent workup and can continue workup as an outpatient as needed. Discussed all findings, treatment plan as well as usual and customary return precautions with patient.  They verbalize understanding and are comfortable with this plan.  Outpatient follow-up has been provided as needed.  All questions have been answered.    CONSULTS: Patient well-appearing without signs of any life-threatening illness and does not need admission at this time.   OUTSIDE RECORDS REVIEWED: No pertinent records for review.         FINAL CLINICAL IMPRESSION(S) / ED DIAGNOSES   Final diagnoses:  Viral pharyngitis     Rx / DC Orders   ED Discharge Orders     None        Note:  This document was prepared using Dragon voice recognition software and may include unintentional dictation errors.   Amaiah Cristiano, Layla Maw, DO 04/27/21 [phone]

## 2021-04-27 NOTE — ED Notes (Signed)
Pt arrived during Epic downtime. Please see downtime forms.

## 2021-04-27 NOTE — ED Triage Notes (Signed)
See paper chart for downtime 

## 2021-06-21 ENCOUNTER — Emergency Department: Payer: BC Managed Care – PPO

## 2021-06-21 ENCOUNTER — Encounter: Payer: Self-pay | Admitting: Emergency Medicine

## 2021-06-21 ENCOUNTER — Emergency Department
Admission: EM | Admit: 2021-06-21 | Discharge: 2021-06-21 | Disposition: A | Discharge status: Home / Self Care | Payer: BC Managed Care – PPO | Attending: Student in an Organized Health Care Education/Training Program | Admitting: Student in an Organized Health Care Education/Training Program

## 2021-06-21 ENCOUNTER — Other Ambulatory Visit: Payer: Self-pay

## 2021-06-21 DIAGNOSIS — R42 Dizziness and giddiness: Secondary | ICD-10-CM

## 2021-06-21 DIAGNOSIS — I10 Essential (primary) hypertension: Secondary | ICD-10-CM | POA: Diagnosis not present

## 2021-06-21 LAB — LIPASE, BLOOD: Lipase: 30 U/L (ref 11–51)

## 2021-06-21 LAB — HEPATIC FUNCTION PANEL
ALT: 29 U/L (ref 0–44)
AST: 24 U/L (ref 15–41)
Albumin: 4.3 g/dL (ref 3.5–5.0)
Alkaline Phosphatase: 58 U/L (ref 38–126)
Bilirubin, Direct: <0.1 mg/dL (ref 0.0–0.2)
Total Bilirubin: 0.6 mg/dL (ref 0.3–1.2)
Total Protein: 7.3 g/dL (ref 6.5–8.1)

## 2021-06-21 LAB — CBC
HCT: 43.1 % (ref 39.0–52.0)
Hemoglobin: 13.6 g/dL (ref 13.0–17.0)
MCH: 27.4 pg (ref 26.0–34.0)
MCHC: 31.6 g/dL (ref 30.0–36.0)
MCV: 86.9 fL (ref 80.0–100.0)
Platelets: 230 10*3/uL (ref 150–400)
RBC: 4.96 MIL/uL (ref 4.22–5.81)
RDW: 13.8 % (ref 11.5–15.5)
WBC: 5.9 10*3/uL (ref 4.0–10.5)
nRBC: 0 % (ref 0.0–0.2)

## 2021-06-21 LAB — BASIC METABOLIC PANEL
Anion gap: 9 (ref 5–15)
BUN: 22 mg/dL — ABNORMAL HIGH (ref 6–20)
CO2: 22 mmol/L (ref 22–32)
Calcium: 9.2 mg/dL (ref 8.9–10.3)
Chloride: 105 mmol/L (ref 98–111)
Creatinine, Ser: 1.23 mg/dL (ref 0.61–1.24)
GFR, Estimated: >60 mL/min (ref >60)
Glucose, Bld: 117 mg/dL — ABNORMAL HIGH (ref 70–99)
Potassium: 4.3 mmol/L (ref 3.5–5.1)
Sodium: 136 mmol/L (ref 135–145)

## 2021-06-21 LAB — TROPONIN I (HIGH SENSITIVITY): Troponin I (High Sensitivity): 3 ng/L (ref <18)

## 2021-06-21 NOTE — ED Triage Notes (Signed)
Pt comes into the ED via POV c/o dizziness and headaches that started yesterday. Pt states he took his BP at home and the systolic was [id number].  PT denies any h/o BP medication, but states he has had previous problems with it elevating.  Pt in NAD at this time with even and unlabored respirations.  ?

## 2021-06-21 NOTE — ED Notes (Signed)
Urine sent if needed °

## 2021-06-21 NOTE — ED Provider Notes (Signed)
? ?[facility] ?Provider Note ? ? ? Event Date/Time  ? First MD Initiated Contact with Patient 06/21/21 1111   ?  (approximate) ? ? ?History  ? ?Dizziness and Headache ? ? ?HPI ? ?Alexander Deleon is a 56 y.o. male who presents to the ER for evaluation of concern over elevated blood pressure episode of headache and dizziness starting 36 hours ago as well as some indigestion symptoms since the weekend.  States he does drink heavily on the weekends.  Denies any chest pain or shortness of breath.  States been compliant with his medications.  Checked his blood pressure and it was elevated this morning was systolic 210.  Denies any blurry vision.  No numbness or tingling.  States his headache is currently very mild. ?  ? ? ?Physical Exam  ? ?Triage Vital Signs: ?ED Triage Vitals  ?Enc Vitals Group  ?   BP 06/21/21 1104 (!) 130/94  ?   Pulse Rate 06/21/21 1104 85  ?   Resp 06/21/21 1104 18  ?   Temp 06/21/21 1104 98.6 ?F (37 ?C)  ?   Temp Source 06/21/21 1104 Oral  ?   SpO2 06/21/21 1104 96 %  ?   Weight 06/21/21 1100 218 lb 0.6 oz (98.9 kg)  ?   Height 06/21/21 1100 6\' 2"  (1.88 m)  ?   Head Circumference --   ?   Peak Flow --   ?   Pain Score 06/21/21 1100 4  ?   Pain Loc --   ?   Pain Edu? --   ?   Excl. in GC? --   ? ? ?Most recent vital signs: ?Vitals:  ? 06/21/21 1104  ?BP: (!) 130/94  ?Pulse: 85  ?Resp: 18  ?Temp: 98.6 ?F (37 ?C)  ?SpO2: 96%  ? ? ? ?Constitutional: Alert  ?Eyes: Conjunctivae are normal.  ?Head: Atraumatic. ?Nose: No congestion/rhinnorhea. ?Mouth/Throat: Mucous membranes are moist.   ?Neck: Painless ROM.  ?Cardiovascular:   Good peripheral circulation. No m/g/r ?Respiratory: Normal respiratory effort.  No retractions. No crackles or wheeze ?Gastrointestinal: Soft and nontender.  ?Musculoskeletal:  no deformity ?Neurologic:  CN- intact.  No facial droop, Normal FNF.  Normal heel to shin.  Sensation intact bilaterally. Normal speech and language. No gross focal neurologic deficits  are appreciated. No gait instability. ? ?Skin:  Skin is warm, dry and intact. No rash noted. ?Psychiatric: Mood and affect are normal. Speech and behavior are normal. ? ? ? ?ED Results / Procedures / Treatments  ? ?Labs ?(all labs ordered are listed, but only abnormal results are displayed) ?Labs Reviewed  ?BASIC METABOLIC PANEL - Abnormal; Notable for the following components:  ?    Result Value  ? Glucose, Bld 117 (*)   ? BUN 22 (*)   ? All other components within normal limits  ?CBC  ?HEPATIC FUNCTION PANEL  ?LIPASE, BLOOD  ?TROPONIN I (HIGH SENSITIVITY)  ? ? ? ?EKG ? ?ED ECG REPORT ?I, 08/21/21, the attending physician, personally viewed and interpreted this ECG. ? ? Date: 06/21/2021 ? EKG Time: 10:59 ? Rate: 85 ? Rhythm: sinus ? Axis: normal ? Intervals: normal ? ST&T Change: non specific st abn, no stemi ? ? ? ?RADIOLOGY ?Please see ED Course for my review and interpretation. ? ?I personally reviewed all radiographic images ordered to evaluate for the above acute complaints and reviewed radiology reports and findings.  These findings were personally discussed with the patient.  Please see  medical record for radiology report. ? ? ? ?PROCEDURES: ? ?Critical Care performed: No ? ?Procedures ? ? ?MEDICATIONS ORDERED IN ED: ?Medications - No data to display ? ? ?IMPRESSION / MDM / ASSESSMENT AND PLAN / ED COURSE  ?I reviewed the triage vital signs and the nursing notes. ?             ?               ? ?Differential diagnosis includes, but is not limited to, tension, dehydration, electrolyte abnormality, hepatitis, pancreatitis, CHF, ACS, CVA, mass, TIA, bleed ? ?Presenting with symptoms as described above.  Clinically well-appearing no focal deficits nondistressed.  Symptoms ongoing for few days.  Does admit to alcohol use with binge drinking over the weekend.  No report of any trauma.  Blood will be ordered for the above differential.  He denies any chest pain or pressure at this time.  EKG is  nonischemic. ? ? ?Clinical Course as of 06/21/21 1418  ?Wed Jun 21, 2021  ?1417 Patient reassessed.  Remains well-appearing in no acute distress.  Ambulating with a steady gait.  No focal neurodeficits.  Lipase and LFTs are normal.  Trope negative no white count no anemia.  CT head no acute abnormality.  He has follow-up with his PCP tomorrow I think is appropriate.  Do not feel he requires hospitalization at this time.  Stable and appropriate for further outpatient follow-up. [PR]  ?  ?Clinical Course User Index ?[PR] Willy Eddy, MD  ? ? ? ?FINAL CLINICAL IMPRESSION(S) / ED DIAGNOSES  ? ?Final diagnoses:  ?Dizziness  ?Hypertension, unspecified type  ? ? ? ?Rx / DC Orders  ? ?ED Discharge Orders   ? ? None  ? ?  ? ? ? ?Note:  This document was prepared using Dragon voice recognition software and may include unintentional dictation errors. ? ?  ?Willy Eddy, MD ?06/21/21 1418 ? ?

## 2021-06-28 ENCOUNTER — Other Ambulatory Visit: Payer: Self-pay | Admitting: Physician Assistant

## 2021-06-28 DIAGNOSIS — F1010 Alcohol abuse, uncomplicated: Secondary | ICD-10-CM

## 2021-07-03 ENCOUNTER — Ambulatory Visit
Admission: RE | Admit: 2021-07-03 | Discharge: 2021-07-03 | Disposition: A | Discharge status: Home / Self Care | Payer: BC Managed Care – PPO | Source: Ambulatory Visit | Attending: Physician Assistant | Admitting: Physician Assistant

## 2021-07-03 DIAGNOSIS — F1010 Alcohol abuse, uncomplicated: Secondary | ICD-10-CM | POA: Diagnosis present

## 2021-07-10 ENCOUNTER — Ambulatory Visit: Payer: BC Managed Care – PPO

## 2021-07-13 ENCOUNTER — Emergency Department
Admission: EM | Admit: 2021-07-13 | Discharge: 2021-07-13 | Disposition: A | Discharge status: Home / Self Care | Payer: BC Managed Care – PPO | Attending: Emergency Medicine | Admitting: Emergency Medicine

## 2021-07-13 ENCOUNTER — Other Ambulatory Visit: Payer: Self-pay

## 2021-07-13 DIAGNOSIS — I10 Essential (primary) hypertension: Secondary | ICD-10-CM | POA: Diagnosis present

## 2021-07-13 DIAGNOSIS — E119 Type 2 diabetes mellitus without complications: Secondary | ICD-10-CM | POA: Diagnosis not present

## 2021-07-13 LAB — BASIC METABOLIC PANEL
Anion gap: 9 (ref 5–15)
BUN: 21 mg/dL — ABNORMAL HIGH (ref 6–20)
CO2: 26 mmol/L (ref 22–32)
Calcium: 9.6 mg/dL (ref 8.9–10.3)
Chloride: 100 mmol/L (ref 98–111)
Creatinine, Ser: 1.16 mg/dL (ref 0.61–1.24)
GFR, Estimated: >60 mL/min (ref >60)
Glucose, Bld: 99 mg/dL (ref 70–99)
Potassium: 3.2 mmol/L — ABNORMAL LOW (ref 3.5–5.1)
Sodium: 135 mmol/L (ref 135–145)

## 2021-07-13 LAB — CBC
HCT: 42 % (ref 39.0–52.0)
Hemoglobin: 13.5 g/dL (ref 13.0–17.0)
MCH: 27.8 pg (ref 26.0–34.0)
MCHC: 32.1 g/dL (ref 30.0–36.0)
MCV: 86.4 fL (ref 80.0–100.0)
Platelets: 177 10*3/uL (ref 150–400)
RBC: 4.86 MIL/uL (ref 4.22–5.81)
RDW: 13.4 % (ref 11.5–15.5)
WBC: 5.7 10*3/uL (ref 4.0–10.5)
nRBC: 0 % (ref 0.0–0.2)

## 2021-07-13 MED ORDER — IRBESARTAN 150 MG PO TABS
150.0000 mg | ORAL_TABLET | Freq: Every day | ORAL | 0 refills | Status: DC
Start: 2021-07-13 — End: 2021-11-24

## 2021-07-13 MED ORDER — IRBESARTAN 150 MG PO TABS
150.0000 mg | ORAL_TABLET | Freq: Once | ORAL | Status: AC
  Administered 2021-07-13: 150 mg via ORAL
  Filled 2021-07-13: qty 1

## 2021-07-13 NOTE — ED Provider Notes (Signed)
? ?[facility] ?Provider Note ? ? ? Event Date/Time  ? First MD Initiated Contact with Patient 07/13/21 2139   ?  (approximate) ? ? ?History  ? ?Hypertension ? ? ?HPI ? ?ZADKIEL DRAGAN is a 56 y.o. male with history of hypertension on irbesartan who presents with elevated blood pressure readings at home.  These ranged between the 140s over 90s up to 190/120.  The patient denies any chest pain, headache, difficulty breathing, lightheadedness, or other associated symptoms.  He states that he ran out of his irbesartan last month and was unable to immediately get a refill.  He is on his diabetes medications. ? ?  ? ? ?Physical Exam  ? ?Triage Vital Signs: ?ED Triage Vitals  ?Enc Vitals Group  ?   BP 07/13/21 2022 (!) 167/108  ?   Pulse Rate 07/13/21 2022 76  ?   Resp 07/13/21 2022 18  ?   Temp 07/13/21 2022 98 ?F (36.7 ?C)  ?   Temp Source 07/13/21 2022 Oral  ?   SpO2 07/13/21 2022 98 %  ?   Weight 07/13/21 2023 218 lb (98.9 kg)  ?   Height 07/13/21 2023 6\' 2"  (1.88 m)  ?   Head Circumference --   ?   Peak Flow --   ?   Pain Score 07/13/21 2023 0  ?   Pain Loc --   ?   Pain Edu? --   ?   Excl. in GC? --   ? ? ?Most recent vital signs: ?Vitals:  ? 07/13/21 2022  ?BP: (!) 167/108  ?Pulse: 76  ?Resp: 18  ?Temp: 98 ?F (36.7 ?C)  ?SpO2: 98%  ? ? ? ?General: Awake, no distress.  ?CV:  Good peripheral perfusion.  ?Resp:  Normal effort.  ?Abd:  No distention.  ?Other:  No edema. ? ? ?ED Results / Procedures / Treatments  ? ?Labs ?(all labs ordered are listed, but only abnormal results are displayed) ?Labs Reviewed  ?BASIC METABOLIC PANEL - Abnormal; Notable for the following components:  ?    Result Value  ? Potassium 3.2 (*)   ? BUN 21 (*)   ? All other components within normal limits  ?CBC  ? ? ? ?EKG ? ? ? ? ?RADIOLOGY ? ? ?PROCEDURES: ? ?Critical Care performed: No ? ?Procedures ? ? ?MEDICATIONS ORDERED IN ED: ?Medications  ?irbesartan (AVAPRO) tablet 150 mg (150 mg Oral Given 07/13/21 2227)   ? ? ? ?IMPRESSION / MDM / ASSESSMENT AND PLAN / ED COURSE  ?I reviewed the triage vital signs and the nursing notes. ? ?56 year old male with a known history of hypertension presents with asymptomatic hypertension in the context of being off of his irbesartan for the last month.  The blood pressure was as high as 190/120 at home, however here it is in the range of 160/100.  He has no other symptoms. ? ?Lab work-up obtained from triage is unremarkable.  His creatinine and the rest of the BMP is normal.  CBC is also normal.  There is no indication for cardiac work-up.  There is no clinical evidence for endorgan dysfunction or any hypertensive urgency. ? ?At this time, the patient is stable for discharge.  I will give him a dose of irbesartan here, and discharged him with a 1 month prescription that he can use until he is able to follow-up with his PMD.  The patient agrees with this plan.  Return precautions given, and he expresses  understanding. ? ? ? ?FINAL CLINICAL IMPRESSION(S) / ED DIAGNOSES  ? ?Final diagnoses:  ?Hypertension, unspecified type  ? ? ? ?Rx / DC Orders  ? ?ED Discharge Orders   ? ?      Ordered  ?  irbesartan (AVAPRO) 150 MG tablet  Daily       ? 07/13/21 2156  ? ?  ?  ? ?  ? ? ? ?Note:  This document was prepared using Dragon voice recognition software and may include unintentional dictation errors.  ?  Dionne Bucy, MD ?07/13/21 2240 ? ?

## 2021-07-13 NOTE — ED Triage Notes (Signed)
Pt presents to ER c/o increased BP that he noticed today.  Pt states he was on irbesartan, but the rx ran out around March and states he Dr. Did not refill the rx.  Pt denies cp, sob or any other pain.  States BP was 190/120 at home.  Pt otherwise A&O x4 at this time in NAD.   ?

## 2021-07-13 NOTE — Discharge Instructions (Addendum)
We have prescribed you a 1 month supply of your medication.  Follow-up with your doctor within the next few weeks to ensure that you have a new prescription for it and can continue. ? ?Return to the ER for new, worsening, or persistent severe elevated blood pressures, any chest pain, difficulty breathing, weakness or lightheadedness, severe headache, or any other new or worsening symptoms that concern you. ?

## 2021-11-19 ENCOUNTER — Emergency Department: Payer: BC Managed Care – PPO

## 2021-11-19 ENCOUNTER — Emergency Department
Admission: EM | Admit: 2021-11-19 | Discharge: 2021-11-20 | Discharge status: Against Medical Advice | Payer: BC Managed Care – PPO | Attending: Emergency Medicine | Admitting: Emergency Medicine

## 2021-11-19 DIAGNOSIS — R310 Gross hematuria: Secondary | ICD-10-CM | POA: Diagnosis not present

## 2021-11-19 DIAGNOSIS — I10 Essential (primary) hypertension: Secondary | ICD-10-CM | POA: Diagnosis not present

## 2021-11-19 DIAGNOSIS — Z20822 Contact with and (suspected) exposure to covid-19: Secondary | ICD-10-CM | POA: Insufficient documentation

## 2021-11-19 DIAGNOSIS — R319 Hematuria, unspecified: Secondary | ICD-10-CM | POA: Diagnosis present

## 2021-11-19 DIAGNOSIS — E119 Type 2 diabetes mellitus without complications: Secondary | ICD-10-CM | POA: Insufficient documentation

## 2021-11-19 LAB — CBC WITH DIFFERENTIAL/PLATELET
Abs Immature Granulocytes: 0.02 10*3/uL (ref 0.00–0.07)
Basophils Absolute: 0 10*3/uL (ref 0.0–0.1)
Basophils Relative: 0 %
Eosinophils Absolute: 0.2 10*3/uL (ref 0.0–0.5)
Eosinophils Relative: 2 %
HCT: 44 % (ref 39.0–52.0)
Hemoglobin: 13.8 g/dL (ref 13.0–17.0)
Immature Granulocytes: 0 %
Lymphocytes Relative: 30 %
Lymphs Abs: 2.3 10*3/uL (ref 0.7–4.0)
MCH: 26.8 pg (ref 26.0–34.0)
MCHC: 31.4 g/dL (ref 30.0–36.0)
MCV: 85.6 fL (ref 80.0–100.0)
Monocytes Absolute: 0.8 10*3/uL (ref 0.1–1.0)
Monocytes Relative: 10 %
Neutro Abs: 4.3 10*3/uL (ref 1.7–7.7)
Neutrophils Relative %: 58 %
Platelets: 204 10*3/uL (ref 150–400)
RBC: 5.14 MIL/uL (ref 4.22–5.81)
RDW: 13.9 % (ref 11.5–15.5)
WBC: 7.6 10*3/uL (ref 4.0–10.5)
nRBC: 0 % (ref 0.0–0.2)

## 2021-11-19 LAB — URINALYSIS, ROUTINE W REFLEX MICROSCOPIC
Bacteria, UA: NONE SEEN
Bilirubin Urine: NEGATIVE
Glucose, UA: 50 mg/dL — AB
Ketones, ur: NEGATIVE mg/dL
Leukocytes,Ua: NEGATIVE
Nitrite: NEGATIVE
Protein, ur: NEGATIVE mg/dL
RBC / HPF: >50 RBC/hpf — ABNORMAL HIGH (ref 0–5)
Specific Gravity, Urine: 1.021 (ref 1.005–1.030)
Squamous Epithelial / HPF: NONE SEEN (ref 0–5)
pH: 7 (ref 5.0–8.0)

## 2021-11-19 LAB — CHLAMYDIA/NGC RT PCR (ARMC ONLY)
Chlamydia Tr: NOT DETECTED
N gonorrhoeae: NOT DETECTED

## 2021-11-19 LAB — COMPREHENSIVE METABOLIC PANEL
ALT: 18 U/L (ref 0–44)
AST: 17 U/L (ref 15–41)
Albumin: 4.1 g/dL (ref 3.5–5.0)
Alkaline Phosphatase: 61 U/L (ref 38–126)
Anion gap: 10 (ref 5–15)
BUN: 15 mg/dL (ref 6–20)
CO2: 25 mmol/L (ref 22–32)
Calcium: 9.1 mg/dL (ref 8.9–10.3)
Chloride: 103 mmol/L (ref 98–111)
Creatinine, Ser: 1.08 mg/dL (ref 0.61–1.24)
GFR, Estimated: >60 mL/min (ref >60)
Glucose, Bld: 106 mg/dL — ABNORMAL HIGH (ref 70–99)
Potassium: 3.7 mmol/L (ref 3.5–5.1)
Sodium: 138 mmol/L (ref 135–145)
Total Bilirubin: 0.7 mg/dL (ref 0.3–1.2)
Total Protein: 7.4 g/dL (ref 6.5–8.1)

## 2021-11-19 LAB — RESP PANEL BY RT-PCR (FLU A&B, COVID) ARPGX2
Influenza A by PCR: NEGATIVE
Influenza B by PCR: NEGATIVE
SARS Coronavirus 2 by RT PCR: NEGATIVE

## 2021-11-19 LAB — GROUP A STREP BY PCR: Group A Strep by PCR: NOT DETECTED

## 2021-11-19 NOTE — ED Triage Notes (Signed)
Hematuria starting today. Burning with urination. Denies back/flank pain.  Also had sore throat and cough x 1 week.

## 2021-11-19 NOTE — ED Provider Triage Note (Signed)
Emergency Medicine Provider Triage Evaluation Note  Alexander Deleon , a 56 y.o. male  was evaluated in triage.  Pt complains of hematuria.  Patient denies any trauma, flank pain, or urinary retention.  He does endorse some dysuria described as burning.  He also reports intermittent cough and sore throat for the last week.  He notes sick contacts at work.  Review of Systems  Positive: Hematuria, cough Negative: Urinary retention  Physical Exam  BP (!) 151/108 (BP Location: Left Arm)   Pulse 99   Temp 99.8 F (37.7 C) (Oral)   Resp 16   Wt 97.1 kg   SpO2 95%   BMI 27.48 kg/m  Gen:   Awake, no distress  NAD Resp:  Normal effort CTA MSK:   Moves extremities without difficulty  CVS:  RRR  Medical Decision Making  Medically screening exam initiated at 7:46 PM.  Appropriate orders placed.  DARNEL MCHAN was informed that the remainder of the evaluation will be completed by another provider, this initial triage assessment does not replace that evaluation, and the importance of remaining in the ED until their evaluation is complete.  Patient to the ED for evaluation of gross hematuria with onset this morning.  He also reports some dysuria as well as some cough and sore throat intermittently for the last week.   Lissa Hoard, PA-C 11/19/21 1948

## 2021-11-20 NOTE — ED Notes (Signed)
Pt states he is tired of waiting and is ready to leave.  Pt walks out.

## 2021-11-20 NOTE — ED Provider Notes (Signed)
[facility] Provider Note    Event Date/Time   First MD Initiated Contact with Patient 11/19/21 2347     (approximate)   History   Hematuria   HPI  Alexander Deleon is a 56 y.o. male with obesity, diabetes, hypertension, and regular alcohol use but no tobacco use.  He presents for evaluation of acute onset gross hematuria.  He said that he was in his normal state of health and has had no recent trauma.  He went to have a bowel movement tonight and when he urinated he noticed that blood came out of his penis.  Even after he stood up he had a few drops of blood come out.  It was not painful although after he saw the blood he had a little bit of burning with urination.  He said that he urinated again when he gave a specimen to [place] and it seemed to clear up to normal urine color.  However he was scared about the blood so he came immediately to the emergency department.  He is also had a mild sore throat and a mild cough for about 1 week.     Physical Exam   Triage Vital Signs: ED Triage Vitals  Enc Vitals Group     BP 11/19/21 1927 (!) 151/108     Pulse Rate 11/19/21 1927 99     Resp 11/19/21 1927 16     Temp 11/19/21 1927 99.8 F (37.7 C)     Temp Source 11/19/21 1927 Oral     SpO2 11/19/21 1927 95 %     Weight 11/19/21 1927 97.1 kg (214 lb 1.1 oz)     Height --      Head Circumference --      Peak Flow --      Pain Score 11/19/21 1934 6     Pain Loc --      Pain Edu? --      Excl. in GC? --     Most recent vital signs: Vitals:   11/19/21 2337 11/20/21 0030  BP: (!) 155/100 (!) 146/101  Pulse: 83 79  Resp: 20 18  Temp: 98.6 F (37 C)   SpO2: 98% 97%     General: Awake, no distress.  Generally well-appearing. CV:  Good peripheral perfusion.  Normal heart sounds. Resp:  Normal effort.  Lungs are clear to auscultation. Abd:  No distention.  No tenderness to palpation nor right or left-sided flank tenderness to percussion. Other:  Oropharynx  is clear with no erythema, petechiae, nor exudate.   ED Results / Procedures / Treatments   Labs (all labs ordered are listed, but only abnormal results are displayed) Labs Reviewed  COMPREHENSIVE METABOLIC PANEL - Abnormal; Notable for the following components:      Result Value   Glucose, Bld 106 (*)    All other components within normal limits  URINALYSIS, ROUTINE W REFLEX MICROSCOPIC - Abnormal; Notable for the following components:   Color, Urine YELLOW (*)    APPearance HAZY (*)    Glucose, UA 50 (*)    Hgb urine dipstick LARGE (*)    RBC / HPF >50 (*)    All other components within normal limits  GROUP A STREP BY PCR  RESP PANEL BY RT-PCR (FLU A&B, COVID) [id number]  CHLAMYDIA/NGC RT PCR ([facility] ONLY)            CBC WITH DIFFERENTIAL/PLATELET     RADIOLOGY I viewed and interpreted the patient's  CT renal stone study.  I see no evidence of renal nor ureteral stones.  I viewed and interpreted the patient's    PROCEDURES:  Critical Care performed: No  Procedures   MEDICATIONS ORDERED IN ED: Medications - No data to display   IMPRESSION / MDM / ASSESSMENT AND PLAN / ED COURSE  I reviewed the triage vital signs and the nursing notes.                              Differential diagnosis includes, but is not limited to, renal/ureteral stones, UTI, STD, bladder cancer, nephritis, nephrosis.  Patient's presentation is most consistent with acute presentation with potential threat to life or bodily function.  Labs/studies ordered: CMP, CBC with differential, respiratory viral panel, urine chlamydia and gonorrhea tests, urinalysis, group A strep by PCR, CT renal stone study.  Work-up is reassuring.  There are no acute abnormalities on any of his lab tests other than hemoglobinuria and greater than 50 RBCs on his urinalysis.  CT scan as documented above is unremarkable.  His physical exam is also reassuring with no obvious abnormality.  I discussed the results with the  patient and I explained to him that there was no specific cause of hematuria identified at this time but it is very important he follow-up with urology and discussed whether he would benefit from a cystoscopy to look for sources of bleeding such as bladder cancer.  He said that he understands and agrees.  Unfortunately he came frustrated at the weight before I can get his discharge paperwork prepared so he left without his paperwork.  Since the patient was being discharged, I do not really consider this an elopement, but he left without paperwork so he did not have contact information.  As a result, I sent an in basket message to Dr. Apolinar Junes with urology to let her know about the patient and ask for her staff to reach out to him to schedule a follow-up appointment.  I had already given the patient my usual verbal return precautions.      FINAL CLINICAL IMPRESSION(S) / ED DIAGNOSES   Final diagnoses:  Gross hematuria     Rx / DC Orders   ED Discharge Orders     None        Note:  This document was prepared using Dragon voice recognition software and may include unintentional dictation errors.   Loleta Rose, MD 11/20/21 [phone]

## 2021-11-23 ENCOUNTER — Encounter: Payer: Self-pay | Admitting: *Deleted

## 2021-11-23 NOTE — Progress Notes (Signed)
11/24/2021 2:16 PM   Alexander Deleon 1965-08-24 [id number]  Referring provider: Preston Fleeting, MD [address] [place],  [place] [address]  No chief complaint on file.   HPI: 56 year old male who presents today for follow-up of an ER visit for painless gross hematuria.  He was seen and evaluated in the emergency room on 11/19/2021 with acute onset of gross hematuria.  He denied any trauma or dysuria associated with this.  This was both in his urination as well as some terminal hematuria drips upon urinary termination.  Urinalysis in the emergency room was unremarkable other than for greater than 50 red blood cells per high-powered field, otherwise negative.  Chlamydia and gonorrhea were negative.  All other labs are unremarkable.  He had a CT noncontrast renal stone study which was also unremarkable.  He reports today that this happened while he was straining with a bowel movement and urinating.  He felt some burning with urination at that point, stood up turned around and finished urinating at which time he still had some burning as well as blood.  1 episode additionally of hematuria thereafter and then it completely subsided.  He no longer has burning urgency or frequency.  No baseline urinary symptoms.  He is a never smoker.  No chemical exposures.  No flank pain or history of kidney stones.  PMH: Past Medical History:  Diagnosis Date   Diabetes mellitus without complication (HCC)    Hypertension     Surgical History: Past Surgical History:  Procedure Laterality Date   COLONOSCOPY WITH PROPOFOL N/A 05/04/2019   Procedure: COLONOSCOPY WITH PROPOFOL;  Surgeon: Wyline Mood, MD;  Location: [facility] ENDOSCOPY;  Service: Gastroenterology;  Laterality: N/A;   KNEE SURGERY Left     Home Medications:  Allergies as of 11/24/2021   No Known Allergies      Medication List        Accurate as of November 24, 2021  2:16 PM. If you have any questions, ask your nurse or  doctor.          STOP taking these medications    metFORMIN 500 MG tablet Commonly known as: GLUCOPHAGE Stopped by: Vanna Scotland, MD       TAKE these medications    atorvastatin 40 MG tablet Commonly known as: LIPITOR Take 40 mg by mouth daily.   irbesartan 150 MG tablet Commonly known as: AVAPRO Take 1 tablet (150 mg total) by mouth daily. What changed: Another medication with the same name was removed. Continue taking this medication, and follow the directions you see here. Changed by: Vanna Scotland, MD   Januvia 100 MG tablet Generic drug: sitaGLIPtin Take 100 mg by mouth daily.        Allergies: No Known Allergies  Family History: Family History  Problem Relation Age of Onset   Hypertension Mother    Hypertension Father     Social History:  reports that he has never smoked. He has never used smokeless tobacco. He reports current alcohol use of about 8.0 standard drinks of alcohol per week. He reports that he does not use drugs.   Physical Exam: BP (!) 154/91   Pulse 96   Ht 6\' 2"  (1.88 m)   Wt 213 lb (96.6 kg)   BMI 27.35 kg/m   Constitutional:  Alert and oriented, No acute distress. HEENT: [place] AT, moist mucus membranes.  Trachea midline, no masses. Skin: No rashes, bruises or suspicious lesions. Neurologic: Grossly intact, no focal deficits,  moving all 4 extremities. Psychiatric: Normal mood and affect.  Laboratory Data: Lab Results  Component Value Date   WBC 7.6 11/19/2021   HGB 13.8 11/19/2021   HCT 44.0 11/19/2021   MCV 85.6 11/19/2021   PLT 204 11/19/2021    Lab Results  Component Value Date   CREATININE 1.08 11/19/2021    No results found for: "PSA"  No results found for: "TESTOSTERONE"  Lab Results  Component Value Date   HGBA1C 7.1 (H) 02/14/2021    Urinalysis    Component Value Date/Time   COLORURINE YELLOW (A) 11/19/2021 1935   APPEARANCEUR HAZY (A) 11/19/2021 1935   LABSPEC 1.021 11/19/2021 1935   PHURINE 7.0  11/19/2021 1935   GLUCOSEU 50 (A) 11/19/2021 1935   HGBUR LARGE (A) 11/19/2021 1935   BILIRUBINUR NEGATIVE 11/19/2021 1935   KETONESUR NEGATIVE 11/19/2021 1935   PROTEINUR NEGATIVE 11/19/2021 1935   NITRITE NEGATIVE 11/19/2021 1935   LEUKOCYTESUR NEGATIVE 11/19/2021 1935    Lab Results  Component Value Date   BACTERIA NONE SEEN 11/19/2021    Pertinent Imaging: CT Renal Stone Study  Narrative CLINICAL DATA:  Flank pain, hematuria, dysuria  EXAM: CT ABDOMEN AND PELVIS WITHOUT CONTRAST  TECHNIQUE: Multidetector CT imaging of the abdomen and pelvis was performed following the standard protocol without IV contrast.  RADIATION DOSE REDUCTION: This exam was performed according to the departmental dose-optimization program which includes automated exposure control, adjustment of the mA and/or kV according to patient size and/or use of iterative reconstruction technique.  COMPARISON:  None Available.  FINDINGS: Lower chest: No acute abnormality.  Hepatobiliary: No focal liver abnormality is seen. No gallstones, gallbladder wall thickening, or biliary dilatation.  Pancreas: Unremarkable  Spleen: Unremarkable  Adrenals/Urinary Tract: Adrenal glands are unremarkable. Kidneys are normal, without renal calculi, focal lesion, or hydronephrosis. Bladder is unremarkable.  Stomach/Bowel: Stomach is within normal limits. Appendix appears normal. No evidence of bowel wall thickening, distention, or inflammatory changes.  Vascular/Lymphatic: Aortic atherosclerosis. No enlarged abdominal or pelvic lymph nodes.  Reproductive: Prostate is unremarkable.  Other: No abdominal wall hernia or abnormality. No abdominopelvic ascites.  Musculoskeletal: Osseous structures are age-appropriate. No acute bone abnormality. No lytic or blastic bone lesion.  IMPRESSION: No acute intra-abdominal pathology identified. No radiographic explanation for the patient's reported symptoms.  Aortic  Atherosclerosis (ICD10-I70.0).   Electronically Signed By: Helyn Numbers M.D. On: 11/19/2021 20:07  The above CT scan was personally reviewed today, agree with radiology interpretation.  I also shared these images with the patient today.  Assessment & Plan:    1. Gross hematuria We discussed the differential diagnosis for gross hematuria including nephrolithiasis, renal or upper tract tumors, bladder stones, UTIs, or bladder tumors as well as undetermined etiologies.  CT scan reviewed, no indication of any GU pathology although this is a noncontrasted study.  Based on urinalysis, there is no concern for infection.  Is also been ruled out for sexually transmitted infections.  Based on the nature of the bleeding, it sounds like it was more lower tract bleeding, may be prostatic bleeding with straining with defecation.  At this point time, I recommended cystoscopy in the office.  We discussed the risk and benefits along with the procedure.  He is agreeable this plan.  Lastly, I asked him to contact his PCP to see if he has been screened for prostate cancer.  If not, we can do that at the time of his follow-up.  Return for cystoscopy   Vanna Scotland, MD  [facility] [address], [address] [place], [place] [address] [phone]   I spent 46 total minutes on the day of the encounter including pre-visit review of the medical record, face-to-face time with the patient, and post visit ordering of labs/imaging/tests.

## 2021-11-24 ENCOUNTER — Ambulatory Visit (INDEPENDENT_AMBULATORY_CARE_PROVIDER_SITE_OTHER): Payer: BC Managed Care – PPO | Admitting: Urology

## 2021-11-24 ENCOUNTER — Ambulatory Visit: Payer: Self-pay | Admitting: Urology

## 2021-11-24 ENCOUNTER — Encounter: Payer: Self-pay | Admitting: Urology

## 2021-11-24 VITALS — BP 154/91 | HR 96 | Ht 74.0 in | Wt 213.0 lb

## 2021-11-24 DIAGNOSIS — R310 Gross hematuria: Secondary | ICD-10-CM

## 2021-11-24 NOTE — Patient Instructions (Signed)
Check with your PCP to find out if he has  been doing PSA blood work  If so, bring the results or have results faxed to our office

## 2021-11-28 ENCOUNTER — Ambulatory Visit (INDEPENDENT_AMBULATORY_CARE_PROVIDER_SITE_OTHER): Payer: BC Managed Care – PPO | Admitting: Urology

## 2021-11-28 ENCOUNTER — Encounter: Payer: Self-pay | Admitting: Urology

## 2021-11-28 VITALS — BP 136/86 | HR 88 | Ht 74.0 in | Wt 213.0 lb

## 2021-11-28 DIAGNOSIS — R310 Gross hematuria: Secondary | ICD-10-CM | POA: Diagnosis not present

## 2021-11-28 NOTE — Progress Notes (Signed)
   11/28/21  CC:  Chief Complaint  Patient presents with   Cysto    HPI: 56 year old male with a personal history of gross hematuria who presents today for cystoscopic evaluation.  Please see previous notes for details.  He has had no further episodes of blood in his urine.    Blood pressure 136/86, pulse 88, height 6\' 2"  (1.88 m), weight 213 lb (96.6 kg). NED. A&Ox3.   No respiratory distress   Abd soft, NT, ND Normal phallus with bilateral descended testicles  Cystoscopy Procedure Note  Patient identification was confirmed, informed consent was obtained, and patient was prepped using Betadine solution.  Lidocaine jelly was administered per urethral meatus.     Pre-Procedure: - Inspection reveals a normal caliber ureteral meatus.  Procedure: The flexible cystoscope was introduced without difficulty - No urethral strictures/lesions are present. - Enlarged prostate bilobar coaptation - Normal bladder neck - Bilateral ureteral orifices identified - Bladder mucosa  reveals no ulcers, tumors, or lesions - No bladder stones - No trabeculation  Retroflexion shows unremarkable   Post-Procedure: - Patient tolerated the procedure well  Assessment/ Plan:  1. Gross hematuria Cystoscopy today is reassuring, no evidence of lower tract pathology  Based on the nature of his gross hematuria, suspect this likely lower urinary tract in nature, possibly related to straining  We discussed the guidelines for work-up for gross hematuria including the recommendation for CT urogram.  He did have a negative noncontrast CT scan which was reassuring.  We discussed that possible missed diagnosis could include an occult renal mass versus upper tract urothelial lesion.  At this point time, he would like to defer CT urogram.  If he does have another episode of gross hematuria or microscopic hematuria down the road, will advocate for this.  He is interested in following up in 6 months with  repeat urinalysis, if microscopic blood and CT urogram.    , MD

## 2021-11-28 NOTE — Addendum Note (Signed)
Addended by: Milas Kocher A on: 11/28/2021 04:36 PM   Modules accepted: Orders

## 2021-11-29 LAB — MICROSCOPIC EXAMINATION
Bacteria, UA: NONE SEEN
Epithelial Cells (non renal): NONE SEEN /hpf (ref 0–10)

## 2021-11-29 LAB — URINALYSIS, COMPLETE
Bilirubin, UA: NEGATIVE
Glucose, UA: NEGATIVE
Ketones, UA: NEGATIVE
Leukocytes,UA: NEGATIVE
Nitrite, UA: NEGATIVE
Protein,UA: NEGATIVE
Specific Gravity, UA: 1.02 (ref 1.005–1.030)
Urobilinogen, Ur: 0.2 mg/dL (ref 0.2–1.0)
pH, UA: 5 (ref 5.0–7.5)

## 2021-11-30 ENCOUNTER — Ambulatory Visit
Admission: EM | Admit: 2021-11-30 | Discharge: 2021-11-30 | Disposition: A | Discharge status: Home / Self Care | Payer: BC Managed Care – PPO | Attending: Emergency Medicine | Admitting: Emergency Medicine

## 2021-11-30 DIAGNOSIS — Z202 Contact with and (suspected) exposure to infections with a predominantly sexual mode of transmission: Secondary | ICD-10-CM

## 2021-11-30 DIAGNOSIS — Z114 Encounter for screening for human immunodeficiency virus [HIV]: Secondary | ICD-10-CM | POA: Diagnosis not present

## 2021-11-30 DIAGNOSIS — Z113 Encounter for screening for infections with a predominantly sexual mode of transmission: Secondary | ICD-10-CM | POA: Insufficient documentation

## 2021-11-30 LAB — HIV ANTIBODY (ROUTINE TESTING W REFLEX): HIV Screen 4th Generation wRfx: NONREACTIVE

## 2021-11-30 NOTE — Discharge Instructions (Addendum)
Your test results will back in the morning.  If either test result is positive you will receive a phone call but if your results are negative they will just appear in your MyChart.

## 2021-11-30 NOTE — ED Provider Notes (Signed)
[facility]    CSN: [id number] Arrival date & time: 11/30/21  1539      History   Chief Complaint Chief Complaint  Patient presents with   HIV testing    HPI Alexander Deleon is a 56 y.o. male.   HPI  56 year old male here requesting HIV testing.  Patient reports that approximately 2 weeks ago he had unprotected sex with a woman he met and then he was told by multiple people that she is HIV positive.  He is concerned that he may have contracted HIV.  He denies any fever, sweating, white coating on his tongue, sore throat, fatigue, muscle pain, headache, mouth sores, or diarrhea.  Past Medical History:  Diagnosis Date   Diabetes mellitus without complication ([place])    Hypertension     Patient Active Problem List   Diagnosis Date Noted   Pneumonia due to COVID-19 virus 07/22/2019   Acute respiratory failure with hypoxia ([place]) 07/22/2019   Alcohol use 07/22/2019    Past Surgical History:  Procedure Laterality Date   COLONOSCOPY WITH PROPOFOL N/A 05/04/2019   Procedure: COLONOSCOPY WITH PROPOFOL;  Surgeon: [name], MD;  Location: [facility] ENDOSCOPY;  Service: Gastroenterology;  Laterality: N/A;   KNEE SURGERY Left        Home Medications    Prior to Admission medications   Medication Sig Start Date End Date Taking? Authorizing Provider  atorvastatin (LIPITOR) 40 MG tablet Take 40 mg by mouth daily. 11/07/21  Yes [provider]  irbesartan (AVAPRO) 150 MG tablet Take 1 tablet (150 mg total) by mouth daily. 02/14/21  Yes Margarette Canada, NP  JANUVIA 100 MG tablet Take 100 mg by mouth daily. 06/22/21  Yes [provider]    Family History Family History  Problem Relation Age of Onset   Hypertension Mother    Hypertension Father     Social History Social History   Tobacco Use   Smoking status: Never   Smokeless tobacco: Never  Vaping Use   Vaping Use: Never used  Substance Use Topics   Alcohol use: Yes    Alcohol/week: 8.0  standard drinks of alcohol    Types: 5 Shots of liquor, 3 Cans of beer per week   Drug use: Never     Allergies   Patient has no known allergies.   Review of Systems Review of Systems  Constitutional:  Negative for diaphoresis and fever.  HENT:  Negative for mouth sores.   Gastrointestinal:  Negative for diarrhea.  Hematological:  Negative for adenopathy.     Physical Exam Triage Vital Signs ED Triage Vitals  Enc Vitals Group     BP      Pulse      Resp      Temp      Temp src      SpO2      Weight      Height      Head Circumference      Peak Flow      Pain Score      Pain Loc      Pain Edu?      Excl. in [place]?    No data found.  Updated Vital Signs BP (!) 142/95 (BP Location: Left Arm)   Pulse 86   Temp 98.2 F (36.8 C) (Oral)   Ht _0  (1.88 m)   Wt 216 lb (98 kg)   SpO2 98%   BMI 27.73 kg/m   Visual Acuity Right  Eye Distance:   Left Eye Distance:   Bilateral Distance:    Right Eye Near:   Left Eye Near:    Bilateral Near:     Physical Exam Vitals and nursing note reviewed.  Constitutional:      Appearance: Normal appearance. He is not ill-appearing.  HENT:     Head: Normocephalic and atraumatic.  Skin:    General: Skin is warm and dry.     Capillary Refill: Capillary refill takes less than 2 seconds.     Findings: No erythema or rash.  Neurological:     General: No focal deficit present.     Mental Status: He is alert and oriented to person, place, and time.  Psychiatric:        Mood and Affect: Mood normal.        Behavior: Behavior normal.        Thought Content: Thought content normal.        Judgment: Judgment normal.      UC Treatments / Results  Labs (all labs ordered are listed, but only abnormal results are displayed) Labs Reviewed  HIV ANTIBODY (ROUTINE TESTING W REFLEX)  RPR    EKG   Radiology No results found.  Procedures Procedures (including critical care time)  Medications Ordered in UC Medications -  No data to display  Initial Impression / Assessment and Plan / UC Course  I have reviewed the triage vital signs and the nursing notes.  Pertinent labs & imaging results that were available during my care of the patient were reviewed by me and considered in my medical decision making (see chart for details).   Patient is a pleasant, nontoxic-appearing 56 year old male here requesting HIV testing due to having recently found out that a recent sex partner is HIV positive.  He denies any symptoms.  He states that the possibility is weighing on his mind.  He was recently seen at the hospital he was tested for gonorrhea and chlamydia but those tests were negative.  They did not test him for HIV or syphilis.  He is requesting both at this timeframe.  I will order both tests and I have informed the patient that his test results will be back in the morning.  If he is positive for either of those disease processes he will be contacted by phone and offer treatment options.  If his results are negative he will receive his results of his MyChart.  I did also caution patient that it can take up to a year for him to convert after exposure to HIV.   Final Clinical Impressions(s) / UC Diagnoses   Final diagnoses:  Routine screening for STI (sexually transmitted infection)     Discharge Instructions      Your test results will back in the morning.  If either test result is positive you will receive a phone call but if your results are negative they will just appear in your MyChart.     ED Prescriptions   None    PDMP not reviewed this encounter.   Margarette Canada, NP 11/30/21 1623

## 2021-11-30 NOTE — ED Triage Notes (Signed)
Patient presents to [facility] for STD screening.   Patient denies any symptoms and request only an HIV test.   Patient reports that he had an STD screening the other week but they did not do HIV.

## 2021-12-01 LAB — RPR: RPR Ser Ql: NONREACTIVE

## 2021-12-06 ENCOUNTER — Ambulatory Visit: Payer: BC Managed Care – PPO

## 2022-03-19 DIAGNOSIS — D860 Sarcoidosis of lung: Secondary | ICD-10-CM

## 2022-03-19 HISTORY — DX: Sarcoidosis of lung: D86.0

## 2022-05-29 ENCOUNTER — Ambulatory Visit: Payer: BC Managed Care – PPO | Admitting: Urology

## 2022-06-06 ENCOUNTER — Ambulatory Visit: Payer: BC Managed Care – PPO | Admitting: Urology

## 2022-06-19 ENCOUNTER — Encounter: Payer: Self-pay | Admitting: Emergency Medicine

## 2022-06-19 ENCOUNTER — Other Ambulatory Visit: Payer: Self-pay

## 2022-06-19 ENCOUNTER — Emergency Department: Payer: BC Managed Care – PPO

## 2022-06-19 ENCOUNTER — Emergency Department
Admission: EM | Admit: 2022-06-19 | Discharge: 2022-06-19 | Disposition: A | Discharge status: Home / Self Care | Payer: BC Managed Care – PPO | Attending: Emergency Medicine | Admitting: Emergency Medicine

## 2022-06-19 DIAGNOSIS — M79651 Pain in right thigh: Secondary | ICD-10-CM | POA: Diagnosis not present

## 2022-06-19 DIAGNOSIS — M79604 Pain in right leg: Secondary | ICD-10-CM

## 2022-06-19 DIAGNOSIS — M79661 Pain in right lower leg: Secondary | ICD-10-CM | POA: Insufficient documentation

## 2022-06-19 MED ORDER — NAPROXEN 500 MG PO TABS: 500.0000 mg | ORAL_TABLET | Freq: Two times a day (BID) | ORAL | 2 refills | Status: DC

## 2022-06-19 NOTE — ED Provider Notes (Signed)
[facility] Provider Note    Event Date/Time   First MD Initiated Contact with Patient 06/19/22 1709     (approximate)   History   Leg Pain   HPI  Alexander Deleon is a 57 y.o. male who presents with complaints of right leg pain.  He reports aching pain over the last 2 days in his posterior thigh.  His PCP referred him to the emergency department for evaluation for DVT.  No shortness of breath or pleurisy.  No history of DVT.  No injury to the area.  No rash.     Physical Exam   Triage Vital Signs: ED Triage Vitals  Enc Vitals Group     BP 06/19/22 1541 (!) 152/100     Pulse Rate 06/19/22 1541 88     Resp 06/19/22 1541 18     Temp 06/19/22 1541 98.5 F (36.9 C)     Temp Source 06/19/22 1541 Oral     SpO2 06/19/22 1541 98 %     Weight 06/19/22 1538 99.8 kg (220 lb)     Height 06/19/22 1538 1.88 m (6\' 2" )     Head Circumference --      Peak Flow --      Pain Score 06/19/22 1538 8     Pain Loc --      Pain Edu? --      Excl. in [place]? --     Most recent vital signs: Vitals:   06/19/22 1541  BP: (!) 152/100  Pulse: 88  Resp: 18  Temp: 98.5 F (36.9 C)  SpO2: 98%     General: Awake, no distress.  CV:  Good peripheral perfusion.  Resp:  Normal effort.  Abd:  No distention.  Other:  Right lower leg exam is reassuring, no rash, evidence of infection, no contusion bruising swelling.  Warm and well-perfused distally, normal range of motion.   ED Results / Procedures / Treatments   Labs (all labs ordered are listed, but only abnormal results are displayed) Labs Reviewed - No data to display   EKG     RADIOLOGY  Ultrasound viewed interpret by me, no DVT   PROCEDURES:  Critical Care performed:   Procedures   MEDICATIONS ORDERED IN ED: Medications - No data to display   IMPRESSION / MDM / [place] / ED COURSE  I reviewed the triage vital signs and the nursing notes. Patient's presentation is most consistent  with acute complicated illness / injury requiring diagnostic workup.  Patient presents with right leg pain as detailed above, differential includes DVT, musculoskeletal pain, Baker's cyst  Exam is reassuring, no evidence of vascular compromise.  Ultrasound negative for DVT.  Evidence of Baker's cyst.  Appropriate for conservative care, NSAIDs, outpatient follow-up.        FINAL CLINICAL IMPRESSION(S) / ED DIAGNOSES   Final diagnoses:  Right leg pain     Rx / DC Orders   ED Discharge Orders          Ordered    naproxen (NAPROSYN) 500 MG tablet  2 times daily with meals,   Status:  Discontinued        06/19/22 1718    naproxen (NAPROSYN) 500 MG tablet  2 times daily with meals        06/19/22 1816             Note:  This document was prepared using Dragon voice recognition software and may include unintentional  dictation errors.   Lavonia Drafts, MD 06/19/22 [phone]

## 2022-06-19 NOTE — ED Triage Notes (Signed)
Pt via POV from [facility]. Pt c/o R leg pain behind his thigh and his calf, states that the provider at [facility] told him to come to rule out DVT. Denies CP/SOB. Pt is A&OX4 and NAD

## 2022-06-27 DIAGNOSIS — E119 Type 2 diabetes mellitus without complications: Secondary | ICD-10-CM | POA: Insufficient documentation

## 2022-06-27 DIAGNOSIS — M7061 Trochanteric bursitis, right hip: Secondary | ICD-10-CM | POA: Insufficient documentation

## 2022-06-27 DIAGNOSIS — M25551 Pain in right hip: Secondary | ICD-10-CM | POA: Insufficient documentation

## 2022-06-27 DIAGNOSIS — M1611 Unilateral primary osteoarthritis, right hip: Secondary | ICD-10-CM | POA: Insufficient documentation

## 2022-06-27 DIAGNOSIS — M4316 Spondylolisthesis, lumbar region: Secondary | ICD-10-CM | POA: Insufficient documentation

## 2022-11-07 ENCOUNTER — Other Ambulatory Visit: Payer: Self-pay

## 2022-11-07 ENCOUNTER — Emergency Department: Payer: BC Managed Care – PPO

## 2022-11-07 ENCOUNTER — Emergency Department
Admission: EM | Admit: 2022-11-07 | Discharge: 2022-11-07 | Disposition: A | Discharge status: Home / Self Care | Payer: BC Managed Care – PPO | Attending: Emergency Medicine | Admitting: Emergency Medicine

## 2022-11-07 DIAGNOSIS — E119 Type 2 diabetes mellitus without complications: Secondary | ICD-10-CM | POA: Insufficient documentation

## 2022-11-07 DIAGNOSIS — H538 Other visual disturbances: Secondary | ICD-10-CM | POA: Diagnosis not present

## 2022-11-07 DIAGNOSIS — I10 Essential (primary) hypertension: Secondary | ICD-10-CM | POA: Insufficient documentation

## 2022-11-07 DIAGNOSIS — R42 Dizziness and giddiness: Secondary | ICD-10-CM | POA: Insufficient documentation

## 2022-11-07 LAB — CBC
HCT: 41.4 % (ref 39.0–52.0)
Hemoglobin: 13.4 g/dL (ref 13.0–17.0)
MCH: 27.3 pg (ref 26.0–34.0)
MCHC: 32.4 g/dL (ref 30.0–36.0)
MCV: 84.5 fL (ref 80.0–100.0)
Platelets: 214 10*3/uL (ref 150–400)
RBC: 4.9 MIL/uL (ref 4.22–5.81)
RDW: 14 % (ref 11.5–15.5)
WBC: 4.8 10*3/uL (ref 4.0–10.5)
nRBC: 0 % (ref 0.0–0.2)

## 2022-11-07 LAB — BASIC METABOLIC PANEL
Anion gap: 9 (ref 5–15)
BUN: 16 mg/dL (ref 6–20)
CO2: 20 mmol/L — ABNORMAL LOW (ref 22–32)
Calcium: 9.9 mg/dL (ref 8.9–10.3)
Chloride: 105 mmol/L (ref 98–111)
Creatinine, Ser: 1.04 mg/dL (ref 0.61–1.24)
GFR, Estimated: >60 mL/min (ref >60)
Glucose, Bld: 106 mg/dL — ABNORMAL HIGH (ref 70–99)
Potassium: 3.9 mmol/L (ref 3.5–5.1)
Sodium: 134 mmol/L — ABNORMAL LOW (ref 135–145)

## 2022-11-07 MED ORDER — ONDANSETRON 4 MG PO TBDP: 4.0000 mg | ORAL_TABLET | Freq: Three times a day (TID) | ORAL | 0 refills | Status: DC | PRN

## 2022-11-07 MED ORDER — MECLIZINE HCL 25 MG PO TABS: 25.0000 mg | ORAL_TABLET | Freq: Three times a day (TID) | ORAL | 0 refills | Status: DC | PRN

## 2022-11-07 NOTE — ED Notes (Signed)
See triage notes. Patient stated he has been feeling dizzy times two weeks.

## 2022-11-07 NOTE — ED Provider Notes (Signed)
[facility] Provider Note    Event Date/Time   First MD Initiated Contact with Patient 11/07/22 1835     (approximate)   History   Dizziness   HPI  Alexander Deleon is a 57 y.o. male with history of diabetes, hypertension who presents with intermittent dizziness over the last several weeks.  He also reports intermittent head pressure in the back of his head, occasional blurry vision.  Currently feels well and has no complaints.  No extremity weakness or numbness     Physical Exam   Triage Vital Signs: ED Triage Vitals  Encounter Vitals Group     BP 11/07/22 1721 (!) 125/99     Systolic BP Percentile --      Diastolic BP Percentile --      Pulse Rate 11/07/22 1721 95     Resp 11/07/22 1721 18     Temp 11/07/22 1721 98.1 F (36.7 C)     Temp Source 11/07/22 1721 Oral     SpO2 11/07/22 1721 99 %     Weight 11/07/22 1722 98.9 kg (218 lb)     Height 11/07/22 1722 1.88 m (6\' 2" )     Head Circumference --      Peak Flow --      Pain Score 11/07/22 1721 0     Pain Loc --      Pain Education --      Exclude from Growth Chart --     Most recent vital signs: Vitals:   11/07/22 1721  BP: (!) 125/99  Pulse: 95  Resp: 18  Temp: 98.1 F (36.7 C)  SpO2: 99%     General: Awake, no distress.  CV:  Good peripheral perfusion.  Resp:  Normal effort.  Abd:  No distention.  Other:  Ambulating well, cranial nerves normal, normal neuroexam   ED Results / Procedures / Treatments   Labs (all labs ordered are listed, but only abnormal results are displayed) Labs Reviewed  BASIC METABOLIC PANEL - Abnormal; Notable for the following components:      Result Value   Sodium 134 (*)    CO2 20 (*)    Glucose, Bld 106 (*)    All other components within normal limits  CBC  CBG MONITORING, ED     EKG  ED ECG REPORT I, Jene Every, the attending physician, personally viewed and interpreted this ECG.  Date: 11/07/2022  Rhythm: normal sinus  rhythm QRS Axis: normal Intervals: normal ST/T Wave abnormalities: normal Narrative Interpretation: no evidence of acute ischemia    RADIOLOGY CT head viewed interpreted by me, no acute abnormality    PROCEDURES:  Critical Care performed:   Procedures   MEDICATIONS ORDERED IN ED: Medications - No data to display   IMPRESSION / MDM / ASSESSMENT AND PLAN / ED COURSE  I reviewed the triage vital signs and the nursing notes. Patient's presentation is most consistent with acute presentation with potential threat to life or bodily function.  Patient presents with intermittent dizziness, head pressure as detailed above.  Differential is extensive but includes electrolyte abnormality, dehydration, less likely intracranial abnormality/stroke, arrhythmia, vertigo  Overall well-appearing and in no acute distress here, lab work reviewed and is unremarkable.  CT head is without abnormality.  EKG reassuring.  I suspect vertigo, will treat with meclizine and Zofran, outpatient follow-up with ENT as needed.      FINAL CLINICAL IMPRESSION(S) / ED DIAGNOSES   Final diagnoses:  Dizziness  Rx / DC Orders   ED Discharge Orders          Ordered    meclizine (ANTIVERT) 25 MG tablet  3 times daily PRN        11/07/22 2020    ondansetron (ZOFRAN-ODT) 4 MG disintegrating tablet  Every 8 hours PRN        11/07/22 2020             Note:  This document was prepared using Dragon voice recognition software and may include unintentional dictation errors.   Jene Every, MD 11/07/22 2251

## 2022-11-07 NOTE — Discharge Instructions (Addendum)
Meclizine can make you sleepy, only take at home.  Zofran can be taken before work

## 2022-11-07 NOTE — ED Triage Notes (Signed)
Pt to ED via POV from home. Pt reports dizziness, blurry vision, head pressure and nausea x2 wks.

## 2022-11-07 NOTE — Progress Notes (Signed)
11/08/2022 5:03 PM   Alexander Deleon 1966-01-23 [id number]  Referring provider: Preston Fleeting, MD [address] [place],  [place] [address]  Urological history: 1. High risk hematuria -non-smoker -non contrast CT (2023) - NED -cysto (2023) - no worrisome findings  2. BPH with LU TS -cysto (2023) -enlarged prostate with bilobar coaptation  Chief Complaint  Patient presents with   Follow-up    HPI: Alexander Deleon is a 57 y.o. male who presents today for elevated PSA.   Previous records reviewed.  PSA trend  2.6 - 2015  3.6 - 2021  11.9 - 2024  He is experiencing symptoms of urinary hesitancy.  Patient denies any modifying or aggravating factors.  Patient denies any recent UTI's, gross hematuria, dysuria or suprapubic/flank pain.  Patient denies any fevers, chills, nausea or vomiting.    I PSS 7/2  PVR 3 mL  UA yellow clear, specific gravity 1.025, specific gravity 1.025, trace blood, pH 5.5, 0-5 WBCs, 0-2 RBCs and 0-10 epithelial cells   IPSS     Row Name 11/08/22 1500         International Prostate Symptom Score   How often have you had the sensation of not emptying your bladder? Not at All     How often have you had to urinate less than every two hours? Less than half the time     How often have you found you stopped and started again several times when you urinated? Not at All     How often have you found it difficult to postpone urination? Less than half the time     How often have you had a weak urinary stream? Less than half the time     How often have you had to strain to start urination? Less than 1 in 5 times     How many times did you typically get up at night to urinate? None     Total IPSS Score 7       Quality of Life due to urinary symptoms   If you were to spend the rest of your life with your urinary condition just the way it is now how would you feel about that? Mostly Satisfied              Score:  1-7 Mild 8-19  Moderate 20-35 Severe   PMH: Past Medical History:  Diagnosis Date   Diabetes mellitus without complication (HCC)    Hypertension     Surgical History: Past Surgical History:  Procedure Laterality Date   COLONOSCOPY WITH PROPOFOL N/A 05/04/2019   Procedure: COLONOSCOPY WITH PROPOFOL;  Surgeon: Wyline Mood, MD;  Location: [facility] ENDOSCOPY;  Service: Gastroenterology;  Laterality: N/A;   KNEE SURGERY Left     Home Medications:  Allergies as of 11/08/2022   No Known Allergies      Medication List        Accurate as of November 08, 2022 11:59 PM. If you have any questions, ask your nurse or doctor.          atorvastatin 40 MG tablet Commonly known as: LIPITOR Take 40 mg by mouth daily.   irbesartan 150 MG tablet Commonly known as: AVAPRO Take 1 tablet (150 mg total) by mouth daily.   Januvia 100 MG tablet Generic drug: sitaGLIPtin Take 100 mg by mouth daily.   meclizine 25 MG tablet Commonly known as: ANTIVERT Take 1 tablet (25 mg total) by mouth 3 (three) times  daily as needed.   naproxen 500 MG tablet Commonly known as: Naprosyn Take 1 tablet (500 mg total) by mouth 2 (two) times daily with a meal.   ondansetron 4 MG disintegrating tablet Commonly known as: ZOFRAN-ODT Take 1 tablet (4 mg total) by mouth every 8 (eight) hours as needed for nausea or vomiting.        Allergies: No Known Allergies  Family History: Family History  Problem Relation Age of Onset   Hypertension Mother    Hypertension Father     Social History:  reports that he has never smoked. He has never used smokeless tobacco. He reports current alcohol use of about 8.0 standard drinks of alcohol per week. He reports that he does not use drugs.  ROS: Pertinent ROS in HPI  Physical Exam: BP 120/82   Pulse 98   Ht 6\' 2"  (1.88 m)   Wt 218 lb (98.9 kg)   BMI 27.99 kg/m   Constitutional:  Well nourished. Alert and oriented, No acute distress. HEENT: [place] AT, moist mucus membranes.   Trachea midline Cardiovascular: No clubbing, cyanosis, or edema. Respiratory: Normal respiratory effort, no increased work of breathing. GU: No CVA tenderness.  No bladder fullness or masses.  Patient with circumcised phallus.  Urethral meatus is patent.  No penile discharge. No penile lesions or rashes. Scrotum without lesions, cysts, rashes and/or edema.  Testicles are located scrotally bilaterally. No masses are appreciated in the testicles. Left and right epididymis are normal. Rectal: Patient with  normal sphincter tone. Anus and perineum without scarring or rashes. No rectal masses are appreciated. Prostate is approximately 45 grams, no nodules are appreciated. Seminal vesicles could not be palpated.  Skin: No rashes, bruises or suspicious lesions. Lymph: No cervical or inguinal adenopathy. Neurologic: Grossly intact, no focal deficits, moving all 4 extremities. Psychiatric: Normal mood and affect.  Laboratory Data: PSA trend  2.6 - 2015  3.6 - 2021  11.9 - 2024  Urinalysis Component     Latest Ref Rng 11/08/2022  Glucose, UA     Negative  Negative   Specific Gravity, UA     1.005 - 1.030  1.025   pH, UA     5.0 - 7.5  5.5   Color, UA     Yellow  Yellow   Appearance Ur     Clear  Clear   Leukocytes,UA     Negative  Negative   Protein,UA     Negative/Trace  Negative   Ketones, UA     Negative  Negative   RBC, UA     Negative  Trace !   Bilirubin, UA     Negative  Negative   Urobilinogen, Ur     0.2 - 1.0 mg/dL 0.2   Nitrite, UA     Negative  Negative   Microscopic Examination See below:     Legend: ! Abnormal  Component     Latest Ref Rng 11/08/2022  WBC, UA     0 - 5 /hpf 0-5   Bacteria, UA     None seen/Few  None seen   RBC, Urine     0 - 2 /hpf 0-2   Epithelial Cells (non renal)     0 - 10 /hpf 0-10     I have reviewed the labs.   Pertinent Imaging:  11/08/22 15:40  Scan Result 3 ml   Assessment & Plan:    1. Elevated PSA -UA benign  -urine  culture pending -Atypical culture  pending -GC/chlamydia PCR pending -Explained that we need to repeat the PSA at some point to confirm elevation -discussed with patient that with a newly elevated PSA, 25% to 40% of the time it will return to baseline levels and for that reason it is advised to repeat the levels in 8 weeks (per 2023 AUA guidelines)  -We reviewed the implications of an elevated PSA and the uncertainty surrounding it. In general, a man's PSA increases with age and is produced by both normal and cancerous prostate tissue. -The differential diagnosis for elevated PSA includes BPH, prostate cancer, infection, recent intercourse/ejaculation, recent urethroscopic manipulation (foley placement/cystoscopy) or trauma, and prostatitis  -Management of an elevated PSA can include observation or prostate biopsy and we discussed this in detail. Our goal is to detect clinically significant prostate cancers, and manage with either active surveillance, surgery, or radiation for localized disease.   2. BPH with LU TS -PSA elevated -DRE benign -cultures pending -repeat PSA (free and total) in 2 months   3. High risk hematuria -non smoker -work up in 2023 - no worrisome findings -no reports of gross heme -UA negative for micro heme  Return in about 2 months (around 01/08/2023) for Free and total PSA .  These notes generated with voice recognition software. I apologize for typographical errors.  Cloretta Ned  [facility] [address]  [address] [place], [place] [address] [phone]

## 2022-11-08 ENCOUNTER — Ambulatory Visit: Payer: BC Managed Care – PPO | Admitting: Urology

## 2022-11-08 VITALS — BP 120/82 | HR 98 | Ht 74.0 in | Wt 218.0 lb

## 2022-11-08 DIAGNOSIS — R972 Elevated prostate specific antigen [PSA]: Secondary | ICD-10-CM | POA: Diagnosis not present

## 2022-11-08 DIAGNOSIS — R319 Hematuria, unspecified: Secondary | ICD-10-CM

## 2022-11-08 DIAGNOSIS — R310 Gross hematuria: Secondary | ICD-10-CM

## 2022-11-08 DIAGNOSIS — N401 Enlarged prostate with lower urinary tract symptoms: Secondary | ICD-10-CM | POA: Diagnosis not present

## 2022-11-08 LAB — BLADDER SCAN AMB NON-IMAGING: Scan Result: 3

## 2022-11-08 LAB — URINALYSIS, COMPLETE
Bilirubin, UA: NEGATIVE
Glucose, UA: NEGATIVE
Ketones, UA: NEGATIVE
Leukocytes,UA: NEGATIVE
Nitrite, UA: NEGATIVE
Protein,UA: NEGATIVE
Specific Gravity, UA: 1.025 (ref 1.005–1.030)
Urobilinogen, Ur: 0.2 mg/dL (ref 0.2–1.0)
pH, UA: 5.5 (ref 5.0–7.5)

## 2022-11-08 LAB — MICROSCOPIC EXAMINATION: Bacteria, UA: NONE SEEN

## 2022-11-11 ENCOUNTER — Encounter: Payer: Self-pay | Admitting: Urology

## 2022-11-11 LAB — CULTURE, URINE COMPREHENSIVE

## 2022-11-12 LAB — CHLAMYDIA/GC NAA, CONFIRMATION
Chlamydia trachomatis, NAA: NEGATIVE
Neisseria gonorrhoeae, NAA: NEGATIVE

## 2022-11-15 ENCOUNTER — Telehealth: Payer: Self-pay | Admitting: *Deleted

## 2022-11-15 DIAGNOSIS — R319 Hematuria, unspecified: Secondary | ICD-10-CM

## 2022-11-15 LAB — MYCOPLASMA / UREAPLASMA CULTURE
Mycoplasma hominis Culture: POSITIVE — AB
Ureaplasma urealyticum: POSITIVE — AB

## 2022-11-15 MED ORDER — DOXYCYCLINE HYCLATE 100 MG PO TABS: 100.0000 mg | ORAL_TABLET | Freq: Two times a day (BID) | ORAL | 0 refills | Status: AC

## 2022-11-15 NOTE — Telephone Encounter (Signed)
Spoke with patient and advised results rx sent to pharmacy by e-script Appt scheduled

## 2022-11-15 NOTE — Addendum Note (Signed)
Addended by: Sueanne Margarita on: 11/15/2022 04:10 PM   Modules accepted: Orders

## 2022-11-15 NOTE — Telephone Encounter (Signed)
-----   Message from [facility] sent at 11/15/2022  1:00 PM EDT ----- Please let Alexander Deleon know that his atypical cultures were positive for infection and he needs to start doxycycline 100 mg twice daily for 10 days.  We then need to recheck his urine for ureaplasma/mycoplasma.

## 2022-11-19 ENCOUNTER — Other Ambulatory Visit: Payer: Self-pay | Admitting: Urology

## 2022-11-19 DIAGNOSIS — R972 Elevated prostate specific antigen [PSA]: Secondary | ICD-10-CM

## 2022-11-20 ENCOUNTER — Encounter: Payer: Self-pay | Admitting: Emergency Medicine

## 2022-11-20 ENCOUNTER — Emergency Department
Admission: EM | Admit: 2022-11-20 | Discharge: 2022-11-20 | Disposition: A | Discharge status: Home / Self Care | Payer: BC Managed Care – PPO | Attending: Emergency Medicine | Admitting: Emergency Medicine

## 2022-11-20 ENCOUNTER — Other Ambulatory Visit: Payer: Self-pay

## 2022-11-20 DIAGNOSIS — E119 Type 2 diabetes mellitus without complications: Secondary | ICD-10-CM | POA: Insufficient documentation

## 2022-11-20 DIAGNOSIS — I10 Essential (primary) hypertension: Secondary | ICD-10-CM | POA: Diagnosis not present

## 2022-11-20 DIAGNOSIS — M545 Low back pain, unspecified: Secondary | ICD-10-CM | POA: Diagnosis present

## 2022-11-20 MED ORDER — PREDNISONE 10 MG (21) PO TBPK: ORAL_TABLET | ORAL | 0 refills | Status: DC

## 2022-11-20 MED ORDER — DEXAMETHASONE SODIUM PHOSPHATE 10 MG/ML IJ SOLN
10.0000 mg | Freq: Once | INTRAMUSCULAR | Status: AC
  Administered 2022-11-20: 10 mg via INTRAMUSCULAR
  Filled 2022-11-20: qty 1

## 2022-11-20 NOTE — ED Triage Notes (Signed)
Pt here with lower back pain since Thursday. 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 Pt denies urinary incontinence. Pt states the pain is worse with movement. Pt states he took pain medication and muscle relaxers that he had at home already but they did not provide any relief.

## 2022-11-20 NOTE — ED Provider Notes (Signed)
[facility] Provider Note  Patient Contact: 1:23 PM (approximate)   History   Back Pain   HPI  Alexander Deleon is a 57 y.o. male presents to the emergency department with low back pain that is nonradiating for the past 4 to 5 days.  Patient reports that he has been taking naproxen and a muscle relaxer at home with no improvement and he is frustrated with his current symptoms.  He is ambulatory with no bowel or bladder incontinence or saddle anesthesia.  Patient does have type 2 diabetes for but reports that his glucose has been well-controlled in the 140s.      Physical Exam   Triage Vital Signs: ED Triage Vitals  Encounter Vitals Group     BP 11/20/22 1242 (!) 161/98     Systolic BP Percentile --      Diastolic BP Percentile --      Pulse Rate 11/20/22 1242 91     Resp 11/20/22 1242 18     Temp 11/20/22 1242 99.2 F (37.3 C)     Temp Source 11/20/22 1242 Oral     SpO2 11/20/22 1242 100 %     Weight 11/20/22 1243 218 lb 0.6 oz (98.9 kg)     Height 11/20/22 1243 6\' 2"  (1.88 m)     Head Circumference --      Peak Flow --      Pain Score 11/20/22 1243 7     Pain Loc --      Pain Education --      Exclude from Growth Chart --     Most recent vital signs: Vitals:   11/20/22 1242  BP: (!) 161/98  Pulse: 91  Resp: 18  Temp: 99.2 F (37.3 C)  SpO2: 100%     General: Alert and in no acute distress. Eyes:  PERRL. EOMI. Head: No acute traumatic findings ENT:      Nose: No congestion/rhinnorhea.      Mouth/Throat: Mucous membranes are moist  Neck: No stridor. No cervical spine tenderness to palpation. Cardiovascular:  Good peripheral perfusion Respiratory: Normal respiratory effort without tachypnea or retractions. Lungs CTAB. Good air entry to the bases with no decreased or absent breath sounds. Gastrointestinal: Bowel sounds 4 quadrants. Soft and nontender to palpation. No guarding or rigidity. No palpable masses. No distention. No CVA  tenderness. Musculoskeletal: Full range of motion to all extremities.  Neurologic:  No gross focal neurologic deficits are appreciated.  Skin:   No rash noted    ED Results / Procedures / Treatments   Labs (all labs ordered are listed, but only abnormal results are displayed) Labs Reviewed - No data to display      PROCEDURES:  Critical Care performed: No  Procedures   MEDICATIONS ORDERED IN ED: Medications  dexamethasone (DECADRON) injection 10 mg (has no administration in time range)     IMPRESSION / MDM / ASSESSMENT AND PLAN / ED COURSE  I reviewed the triage vital signs and the nursing notes.                              Assessment and plan Low back pain 57 year old male presents to the emergency department with bilateral, nonradiating low back pain.  Patient hypertensive at triage but vital signs otherwise reassuring.  No neurodeficits on exam.  As patient has tried conservative therapy with an anti-inflammatory and a muscle relaxer at home, we will  give patient a short burst of prednisone.  Patient given an injection of Decadron.  Patient advised to monitor his glucose levels closely at home.  He feels comfortable with this plan and has Easy Access to the emergency department should symptoms change or worsen.     FINAL CLINICAL IMPRESSION(S) / ED DIAGNOSES   Final diagnoses:  Acute bilateral low back pain without sciatica     Rx / DC Orders   ED Discharge Orders          Ordered    predniSONE (STERAPRED UNI-PAK 21 TAB) 10 MG (21) TBPK tablet        11/20/22 1319             Note:  This document was prepared using Dragon voice recognition software and may include unintentional dictation errors.   Pia Mau Se[place], PA-C 11/20/22 1325    Jene Every, MD 11/20/22 [phone]

## 2022-11-20 NOTE — Discharge Instructions (Addendum)
Take tapered steroid as directed.  

## 2022-12-18 ENCOUNTER — Emergency Department
Admission: EM | Admit: 2022-12-18 | Discharge: 2022-12-18 | Disposition: A | Discharge status: Home / Self Care | Payer: BC Managed Care – PPO | Attending: Emergency Medicine | Admitting: Emergency Medicine

## 2022-12-18 ENCOUNTER — Encounter: Payer: Self-pay | Admitting: Emergency Medicine

## 2022-12-18 ENCOUNTER — Other Ambulatory Visit: Payer: Self-pay

## 2022-12-18 DIAGNOSIS — E119 Type 2 diabetes mellitus without complications: Secondary | ICD-10-CM | POA: Insufficient documentation

## 2022-12-18 DIAGNOSIS — R110 Nausea: Secondary | ICD-10-CM | POA: Insufficient documentation

## 2022-12-18 DIAGNOSIS — I10 Essential (primary) hypertension: Secondary | ICD-10-CM | POA: Insufficient documentation

## 2022-12-18 DIAGNOSIS — R1084 Generalized abdominal pain: Secondary | ICD-10-CM | POA: Diagnosis present

## 2022-12-18 LAB — COMPREHENSIVE METABOLIC PANEL
ALT: 25 U/L (ref 0–44)
AST: 23 U/L (ref 15–41)
Albumin: 4.1 g/dL (ref 3.5–5.0)
Alkaline Phosphatase: 65 U/L (ref 38–126)
Anion gap: 9 (ref 5–15)
BUN: 15 mg/dL (ref 6–20)
CO2: 24 mmol/L (ref 22–32)
Calcium: 9.8 mg/dL (ref 8.9–10.3)
Chloride: 100 mmol/L (ref 98–111)
Creatinine, Ser: 1.04 mg/dL (ref 0.61–1.24)
GFR, Estimated: >60 mL/min (ref >60)
Glucose, Bld: 163 mg/dL — ABNORMAL HIGH (ref 70–99)
Potassium: 3.6 mmol/L (ref 3.5–5.1)
Sodium: 133 mmol/L — ABNORMAL LOW (ref 135–145)
Total Bilirubin: 0.8 mg/dL (ref 0.3–1.2)
Total Protein: 7.4 g/dL (ref 6.5–8.1)

## 2022-12-18 LAB — CBC
HCT: 42.3 % (ref 39.0–52.0)
Hemoglobin: 13.7 g/dL (ref 13.0–17.0)
MCH: 27.7 pg (ref 26.0–34.0)
MCHC: 32.4 g/dL (ref 30.0–36.0)
MCV: 85.5 fL (ref 80.0–100.0)
Platelets: 257 10*3/uL (ref 150–400)
RBC: 4.95 MIL/uL (ref 4.22–5.81)
RDW: 14.1 % (ref 11.5–15.5)
WBC: 4.2 10*3/uL (ref 4.0–10.5)
nRBC: 0 % (ref 0.0–0.2)

## 2022-12-18 LAB — LIPASE, BLOOD: Lipase: 28 U/L (ref 11–51)

## 2022-12-18 MED ORDER — ONDANSETRON 4 MG PO TBDP
4.0000 mg | ORAL_TABLET | Freq: Once | ORAL | Status: AC
  Administered 2022-12-18: 4 mg via ORAL
  Filled 2022-12-18: qty 1

## 2022-12-18 MED ORDER — ALUM & MAG HYDROXIDE-SIMETH 200-200-20 MG/5ML PO SUSP
30.0000 mL | Freq: Once | ORAL | Status: AC
  Administered 2022-12-18: 30 mL via ORAL
  Filled 2022-12-18: qty 30

## 2022-12-18 MED ORDER — LIDOCAINE VISCOUS HCL 2 % MT SOLN
15.0000 mL | Freq: Once | OROMUCOSAL | Status: AC
  Administered 2022-12-18: 15 mL via ORAL
  Filled 2022-12-18: qty 15

## 2022-12-18 NOTE — ED Provider Notes (Signed)
[facility] Provider Note    Event Date/Time   First MD Initiated Contact with Patient 12/18/22 1104     (approximate)   History   Abdominal Pain   HPI  Alexander Deleon is a 57 y.o. male with history of diabetes, hypertension who presents with complaints of nausea, abdominal discomfort.  Patient attributes this to drinking too much alcohol this weekend.  He states this has happened before.  Denies fevers or chills.  No diarrhea, positive nausea     Physical Exam   Triage Vital Signs: ED Triage Vitals  Encounter Vitals Group     BP 12/18/22 1031 (!) 130/93     Systolic BP Percentile --      Diastolic BP Percentile --      Pulse Rate 12/18/22 1031 96     Resp 12/18/22 1031 18     Temp 12/18/22 1031 98.2 F (36.8 C)     Temp Source 12/18/22 1031 Oral     SpO2 12/18/22 1031 96 %     Weight 12/18/22 1128 98.9 kg (218 lb 0.6 oz)     Height 12/18/22 1128 1.88 m (6\' 2" )     Head Circumference --      Peak Flow --      Pain Score 12/18/22 1037 5     Pain Loc --      Pain Education --      Exclude from Growth Chart --     Most recent vital signs: Vitals:   12/18/22 1031  BP: (!) 130/93  Pulse: 96  Resp: 18  Temp: 98.2 F (36.8 C)  SpO2: 96%     General: Awake, no distress.  CV:  Good peripheral perfusion.  Resp:  Normal effort.  Abd:  No distention.  Soft, nontender, reassuring exam Other:     ED Results / Procedures / Treatments   Labs (all labs ordered are listed, but only abnormal results are displayed) Labs Reviewed  COMPREHENSIVE METABOLIC PANEL - Abnormal; Notable for the following components:      Result Value   Sodium 133 (*)    Glucose, Bld 163 (*)    All other components within normal limits  CBC  LIPASE, BLOOD     EKG     RADIOLOGY     PROCEDURES:  Critical Care performed:   Procedures   MEDICATIONS ORDERED IN ED: Medications  ondansetron (ZOFRAN-ODT) disintegrating tablet 4 mg (4 mg Oral Given  12/18/22 1132)  alum & mag hydroxide-simeth (MAALOX/MYLANTA) 200-200-20 MG/5ML suspension 30 mL (30 mLs Oral Given 12/18/22 1132)    And  lidocaine (XYLOCAINE) 2 % viscous mouth solution 15 mL (15 mLs Oral Given 12/18/22 1132)     IMPRESSION / MDM / ASSESSMENT AND PLAN / ED COURSE  I reviewed the triage vital signs and the nursing notes. Patient's presentation is most consistent with acute illness / injury with system symptoms.  Patient presents with nausea, abdominal cramping, fatigue.  He attributes this to alcohol intake, will check labs to ensure no elevated glucose, electrolyte abnormalities.  Lipase is normal  Not consistent with pancreatitis, suspect mild gastritis, feeling much better after GI cocktail.        FINAL CLINICAL IMPRESSION(S) / ED DIAGNOSES   Final diagnoses:  Generalized abdominal pain     Rx / DC Orders   ED Discharge Orders     None        Note:  This document was prepared using Dragon voice  recognition software and may include unintentional dictation errors.   Jene Every, MD 12/18/22 1230

## 2022-12-18 NOTE — ED Triage Notes (Signed)
Pt here with abd pain x2 days. Pt states pain is lower. Pt states he was drinking a lot of the weekend. Pt is nauseous but denies VD.

## 2023-01-09 ENCOUNTER — Other Ambulatory Visit: Payer: BC Managed Care – PPO

## 2023-01-17 ENCOUNTER — Other Ambulatory Visit: Payer: BC Managed Care – PPO

## 2023-01-23 ENCOUNTER — Encounter: Payer: Self-pay | Admitting: Urology

## 2023-02-19 ENCOUNTER — Telehealth: Payer: Self-pay | Admitting: Urology

## 2023-02-19 NOTE — Telephone Encounter (Signed)
Please remind Mr. Alexander Deleon that we needed to repeat his PSA back in October after he finished his antibiotic and he is yet to do that.  He needs to have this blood work drawn as soon as possible.  We need to ensure that his PSA returns to baseline after he has been treated for his UTI.  If it does not, he is at risk of having prostate cancer and we need to do the appropriate testing to rule that out.

## 2023-02-26 ENCOUNTER — Other Ambulatory Visit: Payer: BC Managed Care – PPO

## 2023-02-26 ENCOUNTER — Other Ambulatory Visit: Payer: Self-pay | Admitting: Urology

## 2023-02-26 DIAGNOSIS — R972 Elevated prostate specific antigen [PSA]: Secondary | ICD-10-CM

## 2023-02-26 DIAGNOSIS — R319 Hematuria, unspecified: Secondary | ICD-10-CM

## 2023-02-27 ENCOUNTER — Other Ambulatory Visit: Payer: Self-pay | Admitting: Urology

## 2023-02-27 DIAGNOSIS — R972 Elevated prostate specific antigen [PSA]: Secondary | ICD-10-CM

## 2023-02-27 DIAGNOSIS — N138 Other obstructive and reflux uropathy: Secondary | ICD-10-CM

## 2023-02-27 LAB — URINALYSIS, COMPLETE
Bilirubin, UA: NEGATIVE
Ketones, UA: NEGATIVE
Leukocytes,UA: NEGATIVE
Nitrite, UA: NEGATIVE
Protein,UA: NEGATIVE
RBC, UA: NEGATIVE
Specific Gravity, UA: 1.02 (ref 1.005–1.030)
Urobilinogen, Ur: 0.2 mg/dL (ref 0.2–1.0)
pH, UA: 6.5 (ref 5.0–7.5)

## 2023-02-27 LAB — PSA, TOTAL AND FREE
PSA, Free Pct: 5.5 %
PSA, Free: 0.78 ng/mL
Prostate Specific Ag, Serum: 14.1 ng/mL — ABNORMAL HIGH (ref 0.0–4.0)

## 2023-02-27 LAB — MICROSCOPIC EXAMINATION: Bacteria, UA: NONE SEEN

## 2023-02-27 NOTE — Progress Notes (Signed)
I spoke with Alexander Deleon regarding his recent free and total PSA results indicating a higher probability of prostate cancer.  I recommend that we proceed with a prostate MRI for further stratification and for preparation for fusion biopsy of the prostate and also to evaluate for any possible metastases.

## 2023-02-28 ENCOUNTER — Ambulatory Visit
Admission: RE | Admit: 2023-02-28 | Discharge: 2023-02-28 | Disposition: A | Discharge status: Home / Self Care | Payer: BC Managed Care – PPO | Source: Ambulatory Visit | Attending: Urology | Admitting: Urology

## 2023-02-28 DIAGNOSIS — N401 Enlarged prostate with lower urinary tract symptoms: Secondary | ICD-10-CM | POA: Insufficient documentation

## 2023-02-28 DIAGNOSIS — N138 Other obstructive and reflux uropathy: Secondary | ICD-10-CM | POA: Insufficient documentation

## 2023-02-28 DIAGNOSIS — R972 Elevated prostate specific antigen [PSA]: Secondary | ICD-10-CM | POA: Insufficient documentation

## 2023-02-28 MED ORDER — GADOBUTROL 1 MMOL/ML IV SOLN
10.0000 mL | Freq: Once | INTRAVENOUS | Status: AC | PRN
  Administered 2023-02-28: 10 mL via INTRAVENOUS

## 2023-03-01 LAB — MYCOPLASMA / UREAPLASMA CULTURE

## 2023-03-02 ENCOUNTER — Ambulatory Visit: Admission: RE | Admit: 2023-03-02 | Payer: BC Managed Care – PPO | Source: Ambulatory Visit

## 2023-03-05 ENCOUNTER — Ambulatory Visit: Payer: BC Managed Care – PPO

## 2023-03-08 ENCOUNTER — Ambulatory Visit: Payer: BC Managed Care – PPO | Admitting: Urology

## 2023-03-08 ENCOUNTER — Encounter: Payer: Self-pay | Admitting: Urology

## 2023-03-08 VITALS — BP 143/80 | HR 76

## 2023-03-08 DIAGNOSIS — C61 Malignant neoplasm of prostate: Secondary | ICD-10-CM | POA: Diagnosis not present

## 2023-03-08 DIAGNOSIS — Z2989 Encounter for other specified prophylactic measures: Secondary | ICD-10-CM | POA: Diagnosis not present

## 2023-03-08 DIAGNOSIS — R972 Elevated prostate specific antigen [PSA]: Secondary | ICD-10-CM

## 2023-03-08 MED ORDER — GENTAMICIN SULFATE 40 MG/ML IJ SOLN
80.0000 mg | Freq: Once | INTRAMUSCULAR | Status: AC
Start: 2023-03-08 — End: 2023-03-08
  Administered 2023-03-08: 80 mg via INTRAMUSCULAR

## 2023-03-08 MED ORDER — LEVOFLOXACIN 500 MG PO TABS
500.0000 mg | ORAL_TABLET | Freq: Once | ORAL | Status: AC
Start: 2023-03-08 — End: 2023-03-08
  Administered 2023-03-08: 500 mg via ORAL

## 2023-03-08 NOTE — Patient Instructions (Signed)

## 2023-03-08 NOTE — Progress Notes (Signed)
   03/08/23  Indication: 57 year old male with elevated PSA to 14.1 and abnormal prostate MRI  MRI Fusion Prostate Biopsy Procedure   Informed consent was obtained, and we discussed the risks of bleeding and infection/sepsis. A time out was performed to ensure correct patient identity.  Pre-Procedure: - Last PSA Level: 14.1 - Gentamicin and levaquin given for antibiotic prophylaxis -Prostate measured 23.8 g on MRI, PSA density 1.69   Procedure: - Prostate block performed using 10 cc 1% lidocaine  - MRI fusion biopsy was performed, and 3 biopsies were taken from region of interest # 1: PI-RADS category 4 lesion of the right posteromedial and right posterolateral peripheral zone in the mid gland and base - Standard biopsies taken from sextant areas, 12 under ultrasound guidance. - Total of 15 cores taken -Notably, during the procedure he did have a large watery stool  Post-Procedure: - Patient tolerated the procedure well - He was counseled to seek immediate medical attention if experiences significant bleeding, fevers, or severe pain - Return in one week to discuss biopsy results  Assessment/ Plan: Will follow up in 1-2 weeks to discuss pathology with Dr. Silvio Pate, MD

## 2023-03-27 ENCOUNTER — Ambulatory Visit: Payer: BC Managed Care – PPO | Admitting: Urology

## 2023-03-29 ENCOUNTER — Ambulatory Visit: Payer: BC Managed Care – PPO | Admitting: Urology

## 2023-03-29 VITALS — BP 111/76 | HR 96 | Ht 74.0 in | Wt 218.0 lb

## 2023-03-29 DIAGNOSIS — C61 Malignant neoplasm of prostate: Secondary | ICD-10-CM

## 2023-03-29 NOTE — Progress Notes (Signed)
 Alexander Deleon,acting as a scribe for Rosina Riis, MD.,have documented all relevant documentation on the behalf of Rosina Riis, MD,as directed by  Rosina Riis, MD while in the presence of Rosina Riis, MD.  03/29/23 11:44 AM   Alexander Deleon [birth year] [id number]  Referring provider: [name], MD [address] [place],  [place] [address]  Chief Complaint  Patient presents with   Results    HPI: 58 year-old male who returns today to discuss prostate biopsy results.   He was being followed by Alexander Deleon for elevated PSA up to 14.1. He had a prostate MRI that showed a 23 gram prostate with PSA density of 1.69. He was known to have a PI-RADS 4 lesion at the right posterior medial and right posterior lateral peripheral zones. Fusion biopsy was completed on 03/08/2023. Pathology is consistent with Gleason 4+3 disease involving the right side including 55% area of interest and the right base.   He has no previous abdominal surgeries. His BMI is 28.   He reports mild to moderate erectile dysfunction at baseline. He is concerned about the implications of prostate cancer treatment, including potential side effects and recovery time.    SHIM     Row Name 03/29/23 1101         SHIM: Over the last 6 months:   How do you rate your confidence that you could get and keep an erection? Very Low     When you had erections with sexual stimulation, how often were your erections hard enough for penetration (entering your partner)? Sometimes (about half the time)     During sexual intercourse, how often were you able to maintain your erection after you had penetrated (entered) your partner? A Few Times (much less than half the time)     During sexual intercourse, how difficult was it to maintain your erection to completion of intercourse? Slightly Difficult     When you attempted sexual intercourse, how often was it satisfactory for you? Most Times (much more  than half the time)       SHIM Total Score   SHIM 14              IPSS     Row Name 03/29/23 1100         International Prostate Symptom Score   How often have you had the sensation of not emptying your bladder? Not at All     How often have you had to urinate less than every two hours? About half the time     How often have you found you stopped and started again several times when you urinated? Less than 1 in 5 times     How often have you found it difficult to postpone urination? Less than 1 in 5 times     How often have you had a weak urinary stream? Not at All     How often have you had to strain to start urination? Not at All     How many times did you typically get up at night to urinate? 2 Times     Total IPSS Score 7       Quality of Life due to urinary symptoms   If you were to spend the rest of your life with your urinary condition just the way it is now how would you feel about that? Mostly Satisfied  Score:  1-7 Mild 8-19 Moderate 20-35 Severe  PMH: Past Medical History:  Diagnosis Date   Diabetes mellitus without complication (HCC)    Hypertension     Surgical History: Past Surgical History:  Procedure Laterality Date   COLONOSCOPY WITH PROPOFOL  N/A 05/04/2019   Procedure: COLONOSCOPY WITH PROPOFOL ;  Surgeon: Therisa Bi, MD;  Location: [facility] ENDOSCOPY;  Service: Gastroenterology;  Laterality: N/A;   KNEE SURGERY Left     Home Medications:  Allergies as of 03/29/2023   No Known Allergies      Medication List        Accurate as of March 29, 2023 11:44 AM. If you have any questions, ask your nurse or doctor.          STOP taking these medications    atorvastatin 40 MG tablet Commonly known as: LIPITOR   naproxen  500 MG tablet Commonly known as: Naprosyn    pantoprazole 40 MG tablet Commonly known as: PROTONIX   predniSONE  10 MG (21) Tbpk tablet Commonly known as: STERAPRED UNI-PAK 21 TAB       TAKE these  medications    Descovy 200-25 MG tablet Generic drug: emtricitabine-tenofovir AF Take 1 tablet by mouth daily.   irbesartan  150 MG tablet Commonly known as: AVAPRO  Take 1 tablet (150 mg total) by mouth daily.   Jardiance 25 MG Tabs tablet Generic drug: empagliflozin Take 25 mg by mouth daily.        Family History: Family History  Problem Relation Age of Onset   Hypertension Mother    Hypertension Father     Social History:  reports that he has never smoked. He has never used smokeless tobacco. He reports current alcohol use of about 8.0 standard drinks of alcohol per week. He reports that he does not use drugs.   Physical Exam: BP 111/76 (BP Location: Left Arm, Patient Position: Sitting, Cuff Size: Large)   Pulse 96   Ht 6' 2 (1.88 m)   Wt 218 lb (98.9 kg)   SpO2 99%   BMI 27.99 kg/m   Constitutional:  Alert and oriented, No acute distress. HEENT: [place] AT, moist mucus membranes.  Trachea midline, no masses. Abdomen:  Neurologic: Grossly intact, no focal deficits, moving all 4 extremities. Psychiatric: Normal mood and affect.   Assessment & Plan:    1. Prostate cancer - Newly diagnosed, unfavorable intermediate risk  - He will need further staging in the form of PSMA PET scan versus CT scan, bone scan, depending on insurance. Favor PSMA PET. - Recommended radical prostatectomy due to the patient's relatively young age, which allows for potential future treatment options if the cancer recurs. - Discussed potential side effects of surgery, including urinary incontinence and erectile dysfunction, and the possibility of penile rehabilitation and implants if needed. - Radiation therapy with 6 mo ADT was discussed as an alternative, with potential side effects including urinary and bowel dysfunction, and long-term risks such as secondary cancers.  Declined consultation at this time. - Advised to consider treatment options and follow up within three months to make a  decision. - Referral to physical therapy for pelvic floor exercises to aid in post-operative recovery if surgery is chosen. - Educated on the use of MyChart for communication and follow-up.  - The patient was counseled about the natural history of prostate cancer and the standard treatment options that are available for prostate cancer. It was explained to him how his age and life expectancy, clinical stage, Gleason score, and PSA affect his  prognosis, the decision to proceed with additional staging studies, as well as how that information influences recommended treatment strategies. We discussed the roles for active surveillance, radiation therapy, surgical therapy, androgen deprivation, as well as ablative therapy options for the treatment of prostate cancer as appropriate to his individual cancer situation. We discussed the risks and benefits of these options with regard to their impact on cancer control and also in terms of potential adverse events, complications, and impact on quality of life particularly related to urinary, bowel, and sexual function. The patient was encouraged to ask questions throughout the discussion today and all questions were answered to his stated satisfaction. In addition, the patient was provided with and/or directed to appropriate resources and literature for further education about prostate cancer treatment options.  We discussed surgical therapy for prostate cancer including the different available surgical approaches.  Specifically, we discussed robotic prostatectomy with pelvic lymph node dissection based on his restratification.  We discussed, in detail, the risks and expectations of surgery with regard to cancer control, urinary control, and erectile dysfunction as well as expected post operative recovery processed. Additional risks of surgery including but not limited to bleeding, infection, hernia formation, nerve damage, fistula formation, bowel/rectal injury,  potentially necessitating colostomy, damage to the urinary tract resulting in urinary leakage, urethral stricture, and cardiopulmonary risk such as myocardial infarction, stroke, death, thromboembolism etc. were explained.   I have reviewed the above documentation for accuracy and completeness, and I agree with the above.   Rosina Riis, MD   I spent 47 total minutes on the day of the encounter including pre-visit review of the medical record, face-to-face time with the patient, and post visit ordering of labs/imaging/tests.  [facility] [address] [place], [place] [address] [phone]

## 2023-04-08 NOTE — Addendum Note (Signed)
Addended by: Consuella Lose on: 04/08/2023 01:45 PM   Modules accepted: Orders

## 2023-04-16 ENCOUNTER — Other Ambulatory Visit: Payer: Self-pay

## 2023-04-16 ENCOUNTER — Ambulatory Visit
Admission: RE | Admit: 2023-04-16 | Discharge: 2023-04-16 | Disposition: A | Discharge status: Home / Self Care | Payer: BC Managed Care – PPO | Source: Ambulatory Visit | Attending: Urology | Admitting: Urology

## 2023-04-16 ENCOUNTER — Ambulatory Visit: Payer: BC Managed Care – PPO | Admitting: Urology

## 2023-04-16 VITALS — BP 152/96 | HR 109 | Ht 74.0 in | Wt 218.0 lb

## 2023-04-16 DIAGNOSIS — C61 Malignant neoplasm of prostate: Secondary | ICD-10-CM

## 2023-04-16 LAB — POCT I-STAT CREATININE: Creatinine, Ser: 1.1 mg/dL (ref 0.61–1.24)

## 2023-04-16 MED ORDER — IOHEXOL 300 MG/ML  SOLN
100.0000 mL | Freq: Once | INTRAMUSCULAR | Status: AC | PRN
  Administered 2023-04-16: 100 mL via INTRAVENOUS

## 2023-04-16 NOTE — Progress Notes (Signed)
I,Amy L Pierron,acting as a scribe for Vanna Scotland, MD.,have documented all relevant documentation on the behalf of Vanna Scotland, MD,as directed by  Vanna Scotland, MD while in the presence of Vanna Scotland, MD.  04/16/2023 2:41 PM   Alexander Deleon 03-Apr-1965 [id number]  Referring provider: Preston Fleeting, MD [address] [place],  [place] [address]  Chief Complaint  Patient presents with   Prostate Cancer    HPI: 58 year-old male presents today for further discussion regarding his fusion biopsy results indicating his unfavorable intermediate risk prostate cancer diagnosis. (Please see previous notes for details.)  Initially ordered a PET scan which insurance denied. It was then appealed and insurance countered with having a CT scan and a bone scan. He is scheduled for a CT abdomen, pelvis with contrast later today. Awaiting those results. Unsure why bone scan order is no longer in his chart and needs to be reordered.   He is accompanied today by his brother who also has prostate cancer (low risk) to ask some additional questions.   They inquired about if the cancer has spread. Does he have the kind that is prone to spreading? Also asked what his options are if it has not spread. Why would he need the whole prostate removed instead of just some of it?   How will the procedure affect his erectile function and urinary issues?  If the prostate is removed will he require additional scans or biopsies?  They have a relative who underwent a prostatectomy to help with his urinary issues.    PMH: Past Medical History:  Diagnosis Date   Diabetes mellitus without complication (HCC)    Hypertension     Surgical History: Past Surgical History:  Procedure Laterality Date   COLONOSCOPY WITH PROPOFOL N/A 05/04/2019   Procedure: COLONOSCOPY WITH PROPOFOL;  Surgeon: Wyline Mood, MD;  Location: [facility] ENDOSCOPY;  Service: Gastroenterology;  Laterality: N/A;   KNEE SURGERY  Left     Home Medications:  Allergies as of 04/16/2023   No Known Allergies      Medication List        Accurate as of April 16, 2023  2:41 PM. If you have any questions, ask your nurse or doctor.          Descovy 200-25 MG tablet Generic drug: emtricitabine-tenofovir AF Take 1 tablet by mouth daily.   irbesartan 150 MG tablet Commonly known as: AVAPRO Take 1 tablet (150 mg total) by mouth daily.   Jardiance 25 MG Tabs tablet Generic drug: empagliflozin Take 25 mg by mouth daily.        Family History: Family History  Problem Relation Age of Onset   Hypertension Mother    Hypertension Father     Social History:  reports that he has never smoked. He has never used smokeless tobacco. He reports current alcohol use of about 8.0 standard drinks of alcohol per week. He reports that he does not use drugs.   Physical Exam: BP (!) 152/96   Pulse (!) 109   Ht 6\' 2"  (1.88 m)   Wt 218 lb (98.9 kg)   BMI 27.99 kg/m   Constitutional:  Alert and oriented, No acute distress. HEENT: [place] AT, moist mucus membranes.  Trachea midline, no masses. Neurologic: Grossly intact, no focal deficits, moving all 4 extremities. Psychiatric: Normal mood and affect.   Assessment & Plan:    1. Prostate cancer  - Explained the reason for the CT scan being done today (  staging) -The options of radiation versus surgery depend on above   - For localized prostate cancer, options are surgery or radiation as discussed previously. Explained that the whole prostate would need to be removed, in addition the margin and adjacent lymph nodes.   - Discussed classification, unfavorable intermediate risk prostate cancer. With a younger diagnosed individual it is often helpful to go the surgery route   - Reviewed recovery time for after surgery and lifting restrictions. Reiterated penile rehab after the procedure including physical therapy, penile stretching, daily Viagra. If after a period of time this  hasn't helped other options include injections, or an implant. Post operative urinary symptoms include leakage for the first few weeks which usually requires the use of diapers or pads. Pelvic floor physical therapy and Kegel exercises aid with the healing of the muscles for this.   - Offered a referral to the radiation oncologist Dr. Rushie Chestnut to have his thoughts on that option of treatment. They don't desire a referral at this point in time.   - Once the prostate is gone the PSA works its way out of the blood and becomes undetectable. Typically, no further radiation would be needed. Certain situations that may require it would be if the cancer infiltrated the bladder, spread to lymph nodes, or PSA didn't become undetectable. However, in his case, at this point in time, feel this is unlikely.   - Bone scan also ordered -Favoring prostatectomy at this point if staging c/w localized disease  I have reviewed the above documentation for accuracy and completeness, and I agree with the above.   Vanna Scotland, MD    [facility] [address], [address] [place], [place] [address] [phone]  I spent 35 total minutes on the day of the encounter including pre-visit review of the medical record, face-to-face time with the patient, and post visit ordering of labs/imaging/tests.

## 2023-04-24 ENCOUNTER — Ambulatory Visit
Admission: RE | Admit: 2023-04-24 | Discharge: 2023-04-24 | Discharge status: Home / Self Care | Payer: BC Managed Care – PPO | Source: Ambulatory Visit | Attending: Urology

## 2023-04-24 ENCOUNTER — Ambulatory Visit
Admission: RE | Admit: 2023-04-24 | Discharge: 2023-04-24 | Disposition: A | Discharge status: Home / Self Care | Payer: BC Managed Care – PPO | Source: Ambulatory Visit | Attending: Urology | Admitting: Urology

## 2023-04-24 DIAGNOSIS — C61 Malignant neoplasm of prostate: Secondary | ICD-10-CM | POA: Diagnosis present

## 2023-04-24 MED ORDER — TECHNETIUM TC 99M MEDRONATE IV KIT
20.0000 | PACK | Freq: Once | INTRAVENOUS | Status: AC | PRN
  Administered 2023-04-24: 22.23 via INTRAVENOUS

## 2023-05-08 ENCOUNTER — Encounter: Payer: Self-pay | Admitting: Urology

## 2023-10-07 ENCOUNTER — Telehealth: Payer: Self-pay | Admitting: Urology

## 2023-10-07 NOTE — Telephone Encounter (Signed)
 This is the pt we were talking about

## 2023-11-08 DIAGNOSIS — C61 Malignant neoplasm of prostate: Secondary | ICD-10-CM | POA: Insufficient documentation

## 2023-11-08 NOTE — Progress Notes (Signed)
 11/13/2023 8:26 AM   Tanda ORN Jurline Apr 24, 1965 [id number]  Reason for visit: Follow up prostate Ca   HPI: Initial follow up with me today, previously seen by Dr. Penne Hx of unfavorable intermediate risk prostate cancer   Prostate MRI - PIRADS 4 at right posteromedial PZ, 23g volume Fusion bx (02/2023) - GG3 in 4/13 cores, at primarily Right base, up to 45% involvement PSA 14 at diagnosis  SHIM: 12 IPSS: 20/3  No hx of abdominal surgeries Lives at home with nephew, as good social support Works in [employer], [employer]       Physical Exam: There were no vitals taken for this visit.   Constitutional:  Alert and oriented, No acute distress.  Abd: nondistended  Laboratory Data:  Latest Reference Range & Units 02/26/23 15:55  Prostate Specific Ag, Serum 0.0 - 4.0 ng/mL 14.1 (H)  (H): Data is abnormally high   Latest Reference Range & Units 04/16/23 16:44  Creatinine 0.61 - 1.24 mg/dL 8.89    Pertinent Imaging: I have personally viewed and interpreted the MRI prostate from 02/2023, CT A/P 04/16/23, bone scan. Localized disease, negative nodes, no mets. 23g prostate.    Assessment & Plan:    Prostate cancer [facility]) Assessment & Plan: Unfavorable intermediate risk prostate Ca (dx Dec 2024)  PSA 14, 23g gland MRI/CT/BS - localized disease, no nodes/mets   I had a thoughtful discussion with him today, reiterating many of the points Dr. Penne offered earlier this year. I do think he is a good candidate for a RALP w/ BPLND-- and I re-reviewed this procedure in detail, including expected peri-operative course, outcomes, recovery and potential complications.   We reviewed his prior workup, lab data, and biopsy results. Using this information, and comparing against thousands of men who have come before him, with similar diagnostic findings, we are able to risk-stratify his disease and offer the safest and most effective treatment option. AUA risk  stratifications include very low risk, low risk, intermediate risk, and high risk disease. He meets criteria for unfavorable intermediate risk . I explained the need for additional staging with unfavorable intermediate risk and high risk groups, including conventional CT scans, bone scans, and newer modalities such as PSMA/PET. I explained that his life expectancy, clinical stage, Gleason score, PSA, and relevant co-morbidities influence our treatment strategies and options. We discussed the roles of active surveillance versus definitive therapies, in the form of radiation or surgery. He would not be a reasonable candidate for AS. A treatment should be carefully selected weighing all factors, including patient preference, with a goal to minimize long-term morbidity while preserving cancer-free mortality. For definitive treatments, I reviewed the comparable oncologic outcomes regarding radiation and surgery-- each with their attendant risk profiles and timelines. I emphasized that patients with baseline urinary or lower colonic symptoms may be impacted by treatment strategy, as patients with severe lower urinary tract symptoms may have significant worsening or even develop urinary retention after undergoing radiation. He declined a referral to Rad Onc at his last visit.   In regards to surgery, we discussed the robotic radical prostatectomy +/- lymphadenectomy at length. The procedure takes 3 to 4 hours under general anesthesia, with expected discharge home on post-op day #1.  A Foley catheter is left in place for 7 to 10 days to allow for healing of the vesicourethral anastomosis. There is a small risk of bleeding, infection, damage to surrounding structures or bowel, hernia, DVT/PE, or serious cardiac or pulmonary complications.  We discussed post-op  side effects including erectile dysfunction, and the importance of pre-operative erectile function on long-term outcomes.  Even with a nerve sparing approach, there  is an approximately 25% rate of permanent erectile dysfunction. We also discussed postop urinary incontinence at length.  We expect patients to have stress incontinence post-operatively that slowly improves over a period of weeks to months. Less than 10% of men will require a pad at 1 year after surgery. Pelvic floor strengthening and rehabilitation is strongly recommended to improve these outcomes. Patients will need to avoid heavy lifting and strenuous activity for 3 to 4 weeks, but most men return to their baseline activity status by 6 weeks.  - Proceed with RALP + BLPND - pt prefers first week of Oct 2025 if possible - RTC for pre-op with me at [facility], mid to late Sept - repeat PSA, testosterone , CBC, BMP, coag panel  - Patient resources / Survivorship information provided, online link: [email or web address]       Orders: -     CBC with Differential/Platelet; Future -     PSA; Future -     Testosterone  -     Comprehensive metabolic panel with GFR; Future -     PT and PTT; Future       [name], MD  [facility] [address] [place], [place] [address] [phone]

## 2023-11-08 NOTE — Assessment & Plan Note (Addendum)
 Unfavorable intermediate risk prostate Ca (dx Dec 2024)  PSA 14, 23g gland MRI/CT/BS - localized disease, no nodes/mets   I had a thoughtful discussion with him today, reiterating many of the points Dr. Penne offered earlier this year. I do think he is a good candidate for a RALP w/ BPLND-- and I re-reviewed this procedure in detail, including expected peri-operative course, outcomes, recovery and potential complications.   We reviewed his prior workup, lab data, and biopsy results. Using this information, and comparing against thousands of men who have come before him, with similar diagnostic findings, we are able to risk-stratify his disease and offer the safest and most effective treatment option. AUA risk stratifications include very low risk, low risk, intermediate risk, and high risk disease. He meets criteria for unfavorable intermediate risk . I explained the need for additional staging with unfavorable intermediate risk and high risk groups, including conventional CT scans, bone scans, and newer modalities such as PSMA/PET. I explained that his life expectancy, clinical stage, Gleason score, PSA, and relevant co-morbidities influence our treatment strategies and options. We discussed the roles of active surveillance versus definitive therapies, in the form of radiation or surgery. He would not be a reasonable candidate for AS. A treatment should be carefully selected weighing all factors, including patient preference, with a goal to minimize long-term morbidity while preserving cancer-free mortality. For definitive treatments, I reviewed the comparable oncologic outcomes regarding radiation and surgery-- each with their attendant risk profiles and timelines. I emphasized that patients with baseline urinary or lower colonic symptoms may be impacted by treatment strategy, as patients with severe lower urinary tract symptoms may have significant worsening or even develop urinary retention after  undergoing radiation. He declined a referral to Rad Onc at his last visit.   In regards to surgery, we discussed the robotic radical prostatectomy +/- lymphadenectomy at length. The procedure takes 3 to 4 hours under general anesthesia, with expected discharge home on post-op day #1.  A Foley catheter is left in place for 7 to 10 days to allow for healing of the vesicourethral anastomosis. There is a small risk of bleeding, infection, damage to surrounding structures or bowel, hernia, DVT/PE, or serious cardiac or pulmonary complications.  We discussed post-op side effects including erectile dysfunction, and the importance of pre-operative erectile function on long-term outcomes.  Even with a nerve sparing approach, there is an approximately 25% rate of permanent erectile dysfunction. We also discussed postop urinary incontinence at length.  We expect patients to have stress incontinence post-operatively that slowly improves over a period of weeks to months. Less than 10% of men will require a pad at 1 year after surgery. Pelvic floor strengthening and rehabilitation is strongly recommended to improve these outcomes. Patients will need to avoid heavy lifting and strenuous activity for 3 to 4 weeks, but most men return to their baseline activity status by 6 weeks.  - Proceed with RALP + BLPND - pt prefers first week of Oct 2025 if possible - RTC [facility]op [facility], mid to late Sept - repeat PSA, testosterone , CBC, BMP, coag panel  - Patient resources / Survivorship information provided, online link: [email or web address]

## 2023-11-13 ENCOUNTER — Other Ambulatory Visit
Admission: RE | Admit: 2023-11-13 | Discharge: 2023-11-13 | Disposition: A | Discharge status: Home / Self Care | Attending: Urology | Admitting: Urology

## 2023-11-13 ENCOUNTER — Ambulatory Visit: Admitting: Urology

## 2023-11-13 DIAGNOSIS — C61 Malignant neoplasm of prostate: Secondary | ICD-10-CM | POA: Diagnosis present

## 2023-11-13 LAB — CBC WITH DIFFERENTIAL/PLATELET
Abs Immature Granulocytes: 0.02 K/uL (ref 0.00–0.07)
Basophils Absolute: 0 K/uL (ref 0.0–0.1)
Basophils Relative: 1 %
Eosinophils Absolute: 0.3 K/uL (ref 0.0–0.5)
Eosinophils Relative: 7 %
HCT: 42.5 % (ref 39.0–52.0)
Hemoglobin: 14.2 g/dL (ref 13.0–17.0)
Immature Granulocytes: 1 %
Lymphocytes Relative: 26 %
Lymphs Abs: 1.1 K/uL (ref 0.7–4.0)
MCH: 27.3 pg (ref 26.0–34.0)
MCHC: 33.4 g/dL (ref 30.0–36.0)
MCV: 81.6 fL (ref 80.0–100.0)
Monocytes Absolute: 0.5 K/uL (ref 0.1–1.0)
Monocytes Relative: 12 %
Neutro Abs: 2.3 K/uL (ref 1.7–7.7)
Neutrophils Relative %: 53 %
Platelets: 179 K/uL (ref 150–400)
RBC: 5.21 MIL/uL (ref 4.22–5.81)
RDW: 14.6 % (ref 11.5–15.5)
WBC: 4.2 K/uL (ref 4.0–10.5)
nRBC: 0 % (ref 0.0–0.2)

## 2023-11-13 LAB — COMPREHENSIVE METABOLIC PANEL WITH GFR
ALT: 26 U/L (ref 0–44)
AST: 21 U/L (ref 15–41)
Albumin: 4.1 g/dL (ref 3.5–5.0)
Alkaline Phosphatase: 90 U/L (ref 38–126)
Anion gap: 11 (ref 5–15)
BUN: 25 mg/dL — ABNORMAL HIGH (ref 6–20)
CO2: 24 mmol/L (ref 22–32)
Calcium: 10.8 mg/dL — ABNORMAL HIGH (ref 8.9–10.3)
Chloride: 97 mmol/L — ABNORMAL LOW (ref 98–111)
Creatinine, Ser: 1.39 mg/dL — ABNORMAL HIGH (ref 0.61–1.24)
GFR, Estimated: 58 mL/min — ABNORMAL LOW (ref >60)
Glucose, Bld: 167 mg/dL — ABNORMAL HIGH (ref 70–99)
Potassium: 4 mmol/L (ref 3.5–5.1)
Sodium: 132 mmol/L — ABNORMAL LOW (ref 135–145)
Total Bilirubin: 0.8 mg/dL (ref 0.0–1.2)
Total Protein: 7.9 g/dL (ref 6.5–8.1)

## 2023-11-13 LAB — PROTIME-INR
INR: 1 (ref 0.8–1.2)
Prothrombin Time: 13.8 s (ref 11.4–15.2)

## 2023-11-13 LAB — APTT: aPTT: 33 s (ref 24–36)

## 2023-11-13 LAB — PSA: Prostatic Specific Antigen: 16.35 ng/mL — ABNORMAL HIGH (ref 0.00–4.00)

## 2023-11-13 NOTE — Addendum Note (Signed)
 Addended by: ELOUISE SANTA BROCKS on: 11/13/2023 11:24 AM   Modules accepted: Orders

## 2023-11-13 NOTE — Patient Instructions (Signed)
 We reviewed your diagnosis of prostate cancer today. We recognize this is a lot of information, and it is normal to feel overwhelmed. Please know you are not alone, and we are here to support you through each step.  You are welcome to contact our office with follow-up questions or to schedule another visit with Dr. Georganne. Trusted resources include NCCN (TuxService.com.cy), [email or web address] and the Prostate Cancer Foundation (sistemancia.com). Support groups and survivorship information are available through our clinic and local cancer support services.  Taking time to process this news is important--lean on your family, friends, and our care team as needed. We will work together to make the best plan for your health and quality of life.

## 2023-11-14 ENCOUNTER — Other Ambulatory Visit: Payer: Self-pay

## 2023-11-14 ENCOUNTER — Other Ambulatory Visit: Payer: Self-pay | Admitting: Urology

## 2023-11-14 DIAGNOSIS — C61 Malignant neoplasm of prostate: Secondary | ICD-10-CM

## 2023-11-14 LAB — TESTOSTERONE: Testosterone: 330 ng/dL (ref 264–916)

## 2023-11-14 NOTE — Progress Notes (Signed)
 Placing OR request, tentative 1st week of Oct

## 2023-11-14 NOTE — Progress Notes (Signed)
 Surgical Physician Order Form [facility] [place]  Dr. Georganne, MD  * Scheduling expectation : Next Available (pt prefers 1st week of Oct 2025)  *Length of Case: 3 hours  *Clearance needed: no  *Anticoagulation Instructions: Hold all anticoagulants  *Aspirin Instructions: Ok to continue Aspirin  *Post-op visit Date/Instructions:  1 week cath removal  *Diagnosis: Prostate Cancer  *Procedure: bilateral Robotic laparoscopic Prostatectomy (44133)   Additional orders: N/A  -Admit type: Observation  -Anesthesia: General  -VTE Prophylaxis Standing Order SCD's       Other:   -Standing Lab Orders Per Anesthesia    Lab other: cbc, bmp, coag panel, type and screen, UA  -Standing Test orders EKG/Chest x-ray per Anesthesia       Test other:   - Medications:  Ancef 2gm IV  -Other orders:  N/A

## 2023-11-20 ENCOUNTER — Telehealth: Payer: Self-pay

## 2023-11-20 NOTE — Telephone Encounter (Signed)
 Per Dr. Georganne, Patient is to be scheduled for  Bilateral Robotic Laparoscopic Prostatectomy   Alexander Deleon was contacted and possible surgical dates were discussed, Tuesday October 7th, 2025 was agreed upon for surgery.   Patient was directed to call [phone] between 1-3pm the day before surgery to find out surgical arrival time.  Instructions were given not to eat or drink from midnight on the night before surgery and have a driver for the day of surgery. On the surgery day patient was instructed to enter through the [facility] entrance of [facility] report the Same Day Surgery desk.   Pre-Admit Testing will be in contact via phone to set up an interview with the anesthesia team to review your history and medications prior to surgery.   Reminder of this information was sent via Mail to the patient.

## 2023-11-20 NOTE — Progress Notes (Signed)
   [facility] Surgical Posting Form  Surgery Date: Date: 12/24/2023  Surgeon: Dr. Penne Skye, MD  Inpt ( No  )   Outpt (No)   Obs ( Yes  )   Diagnosis: C61 Prostate Cancer  -CPT: [id number]  Surgery: Bilateral Robotic Laparoscopic Prostatectomy   Stop Anticoagulations: Yes, hold all  Cardiac/Medical/Pulmonary Clearance needed: no  *Orders entered into EPIC  Date: 11/20/23   *Case booked in EPIC  Date: 11/19/2023  *Notified pt of Surgery: Date: 11/19/2023  PRE-OP UA & CX: Yes, will also get CBC, BMP, Coag Panel, Type and Screen.   *Placed into Prior Authorization Work Que Date: 11/20/23  Assistant/laser/rep:Yes, will need First Assist

## 2023-11-25 NOTE — Progress Notes (Signed)
 12/05/2023 10:31 AM   Alexander Deleon November 13, 1965 [id number]  Reason for visit: Follow up prostate Ca   HPI: Here today for pre-op visit for a RALP + BPLND, scheduled for 12/24/23   Prior HPI: Initial follow up with me today, previously seen by Dr. 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  Abd: nondistended  Laboratory Data:  Latest Reference Range & Units 11/13/23 09:11  Prostatic Specific Antigen 0.00 - 4.00 ng/mL 16.35 (H)  (H): Data is abnormally high   Latest Reference Range & Units 11/13/23 09:11  Creatinine 0.61 - 1.24 mg/dL 8.60 (H)  (H): Data is abnormally high   Latest Reference Range & Units 11/13/23 09:11  WBC 4.0 - 10.5 K/uL 4.2  RBC 4.22 - 5.81 MIL/uL 5.21  Hemoglobin 13.0 - 17.0 g/dL 85.7  HCT 60.9 - 47.9 % 42.5   Pertinent Imaging: I have personally viewed and interpreted the MRI prostate from 02/2023, CT A/P 04/16/23, bone scan. Localized disease, negative nodes, no mets. 23g prostate.    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 Using this information, and comparing against thousands of men who have come before him, with similar diagnostic findings, we are able to  risk-stratify his disease and offer the safest and most effective treatment option. AUA risk stratifications include very low risk, low risk, intermediate risk, and high risk disease. He meets criteria for unfavorable intermediate risk . I explained the need for additional staging with unfavorable intermediate risk and high risk groups, including conventional CT scans, bone scans, and newer modalities such as PSMA/PET. I explained that his life expectancy, clinical stage, Gleason score, PSA, and relevant co-morbidities influence our treatment strategies and options. We discussed the roles of active surveillance versus definitive therapies, in the form of radiation or surgery. He would not be a reasonable candidate for AS. A treatment should be carefully selected weighing all factors, including patient preference, with a goal to minimize long-term morbidity while preserving cancer-free mortality. For definitive treatments, I reviewed the comparable oncologic outcomes regarding radiation and surgery-- each with their attendant risk profiles and timelines. I emphasized that patients with baseline urinary or lower colonic symptoms may be impacted by treatment strategy, as patients with severe lower urinary tract symptoms may have significant worsening or even develop urinary retention after undergoing radiation. He declined a referral to Rad Onc at his last visit.   In regards to surgery, we discussed the robotic radical prostatectomy +/- lymphadenectomy at length. The procedure takes 3 to 4 hours under general anesthesia, with expected discharge home on post-op day #1.  A Foley catheter is left in  place for 7 to 10 days to allow for healing of the vesicourethral anastomosis. There is a small risk of bleeding, infection, damage to surrounding structures or bowel, hernia, DVT/PE, or serious cardiac or pulmonary complications.  We discussed post-op side effects including erectile dysfunction, and the importance of  pre-operative erectile function on long-term outcomes.  Even with a nerve sparing approach, there is an approximately 25% rate of permanent erectile dysfunction. We also discussed postop urinary incontinence at length.  We expect patients to have stress incontinence post-operatively that slowly improves over a period of weeks to months. Less than 10% of men will require a pad at 1 year after surgery. Pelvic floor strengthening and rehabilitation is strongly recommended to improve these outcomes. Patients will need to avoid heavy lifting and strenuous activity for 3 to 4 weeks, but most men return to their baseline activity status by 6 weeks.  - Proceed with RALP + BPLND on 12/24/23    - outpatient catheter removal at 7 days  - will collect new UA today (he is have new suprapubic discomfort and q23min urgency             [name], MD  [facility] [address], [address] [place], [place] [address] [phone]

## 2023-11-25 NOTE — H&P (View-Only) (Signed)
 12/05/2023 10:31 AM   Tanda ORN Jurline November 13, 1965 [id number]  Reason for visit: Follow up prostate Ca   HPI: Here today for pre-op visit for a RALP + BPLND, scheduled for 12/24/23   Prior HPI: Initial follow up with me today, previously seen by Dr. 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  Abd: nondistended  Laboratory Data:  Latest Reference Range & Units 11/13/23 09:11  Prostatic Specific Antigen 0.00 - 4.00 ng/mL 16.35 (H)  (H): Data is abnormally high   Latest Reference Range & Units 11/13/23 09:11  Creatinine 0.61 - 1.24 mg/dL 8.60 (H)  (H): Data is abnormally high   Latest Reference Range & Units 11/13/23 09:11  WBC 4.0 - 10.5 K/uL 4.2  RBC 4.22 - 5.81 MIL/uL 5.21  Hemoglobin 13.0 - 17.0 g/dL 85.7  HCT 60.9 - 47.9 % 42.5   Pertinent Imaging: I have personally viewed and interpreted the MRI prostate from 02/2023, CT A/P 04/16/23, bone scan. Localized disease, negative nodes, no mets. 23g prostate.    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 Using this information, and comparing against thousands of men who have come before him, with similar diagnostic findings, we are able to  risk-stratify his disease and offer the safest and most effective treatment option. AUA risk stratifications include very low risk, low risk, intermediate risk, and high risk disease. He meets criteria for unfavorable intermediate risk . I explained the need for additional staging with unfavorable intermediate risk and high risk groups, including conventional CT scans, bone scans, and newer modalities such as PSMA/PET. I explained that his life expectancy, clinical stage, Gleason score, PSA, and relevant co-morbidities influence our treatment strategies and options. We discussed the roles of active surveillance versus definitive therapies, in the form of radiation or surgery. He would not be a reasonable candidate for AS. A treatment should be carefully selected weighing all factors, including patient preference, with a goal to minimize long-term morbidity while preserving cancer-free mortality. For definitive treatments, I reviewed the comparable oncologic outcomes regarding radiation and surgery-- each with their attendant risk profiles and timelines. I emphasized that patients with baseline urinary or lower colonic symptoms may be impacted by treatment strategy, as patients with severe lower urinary tract symptoms may have significant worsening or even develop urinary retention after undergoing radiation. He declined a referral to Rad Onc at his last visit.   In regards to surgery, we discussed the robotic radical prostatectomy +/- lymphadenectomy at length. The procedure takes 3 to 4 hours under general anesthesia, with expected discharge home on post-op day #1.  A Foley catheter is left in  place for 7 to 10 days to allow for healing of the vesicourethral anastomosis. There is a small risk of bleeding, infection, damage to surrounding structures or bowel, hernia, DVT/PE, or serious cardiac or pulmonary complications.  We discussed post-op side effects including erectile dysfunction, and the importance of  pre-operative erectile function on long-term outcomes.  Even with a nerve sparing approach, there is an approximately 25% rate of permanent erectile dysfunction. We also discussed postop urinary incontinence at length.  We expect patients to have stress incontinence post-operatively that slowly improves over a period of weeks to months. Less than 10% of men will require a pad at 1 year after surgery. Pelvic floor strengthening and rehabilitation is strongly recommended to improve these outcomes. Patients will need to avoid heavy lifting and strenuous activity for 3 to 4 weeks, but most men return to their baseline activity status by 6 weeks.  - Proceed with RALP + BPLND on 12/24/23    - outpatient catheter removal at 7 days  - will collect new UA today (he is have new suprapubic discomfort and q23min urgency             [name], MD  [facility] [address], [address] [place], [place] [address] [phone]

## 2023-11-25 NOTE — Assessment & Plan Note (Addendum)
 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 Using this information, and comparing against thousands of men who have come before him, with similar diagnostic findings, we are able to risk-stratify his disease and offer the safest and most effective treatment option. AUA risk stratifications include very low risk, low risk, intermediate risk, and high risk disease. He meets criteria for unfavorable intermediate risk . I explained the need for additional staging with unfavorable intermediate risk and high risk groups, including conventional CT scans, bone scans, and newer modalities such as PSMA/PET. I explained that his life expectancy, clinical stage, Gleason score, PSA, and relevant co-morbidities influence our treatment strategies and options. We discussed the roles of active surveillance versus definitive therapies, in the form of radiation or surgery. He would not be a reasonable candidate for AS. A treatment should be carefully selected weighing all factors, including patient preference, with a goal to minimize long-term morbidity while preserving cancer-free mortality. For definitive treatments, I reviewed the comparable oncologic outcomes regarding radiation and surgery-- each with their attendant risk profiles and timelines. I emphasized that patients with baseline urinary or lower colonic symptoms may be impacted by treatment strategy, as patients with severe lower urinary tract symptoms may have significant worsening or even develop urinary retention after undergoing radiation. He declined a referral to Rad Onc at his last visit.   In regards to surgery, we discussed the robotic radical prostatectomy +/- lymphadenectomy at length. The procedure takes 3 to 4 hours under general anesthesia, with expected discharge home on post-op day #1.  A Foley catheter is left in  place for 7 to 10 days to allow for healing of the vesicourethral anastomosis. There is a small risk of bleeding, infection, damage to surrounding structures or bowel, hernia, DVT/PE, or serious cardiac or pulmonary complications.  We discussed post-op side effects including erectile dysfunction, and the importance of pre-operative erectile function on long-term outcomes.  Even with a nerve sparing approach, there is an approximately 25% rate of permanent erectile dysfunction. We also discussed postop urinary incontinence at length.  We expect patients to have stress incontinence post-operatively that slowly improves over a period of weeks to months. Less than 10% of men will require a pad at 1 year after surgery. Pelvic floor strengthening and rehabilitation is strongly recommended to improve these outcomes. Patients will need to avoid heavy lifting and strenuous activity for 3 to 4 weeks, but most men return to their baseline activity status by 6 weeks.  - Proceed with RALP + BPLND on 12/24/23    - outpatient catheter removal at 7 days  - will collect new UA today (he is have new suprapubic discomfort and q7min urgency

## 2023-11-26 IMAGING — US US ABDOMEN COMPLETE
1 series · 14 of 25 positions shown · non-contrast
Comparison: None.

CLINICAL DATA: Left lower quadrant pain. Generalized abdominal
pain.

EXAM:
ABDOMEN ULTRASOUND COMPLETE

[Series 1: us abdomen complete · 0.22mm/px · 14 of 91 slices shown]
[im 1/91]
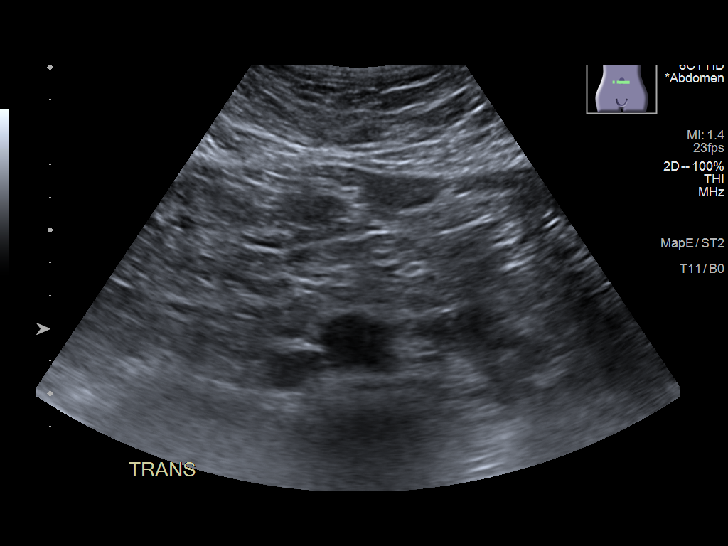
[im 8/91]
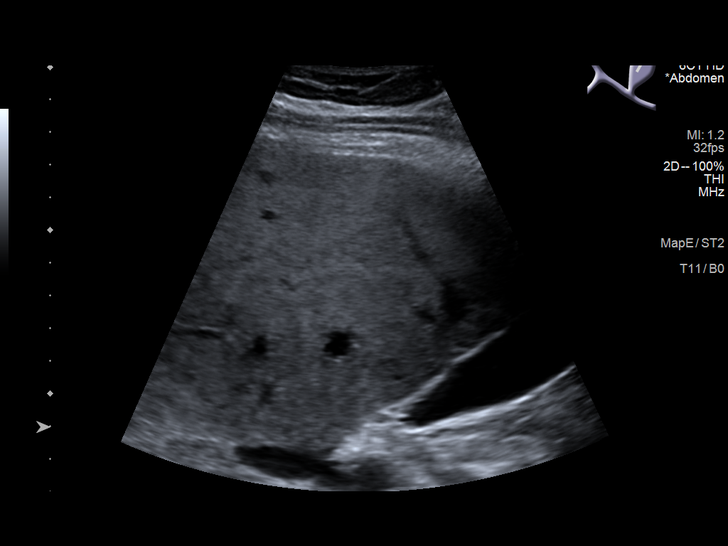
[im 16/91]
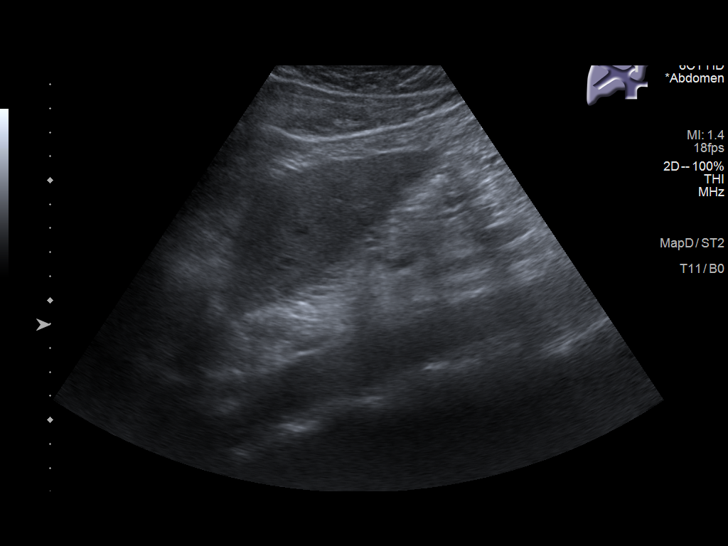
[im 23/91]
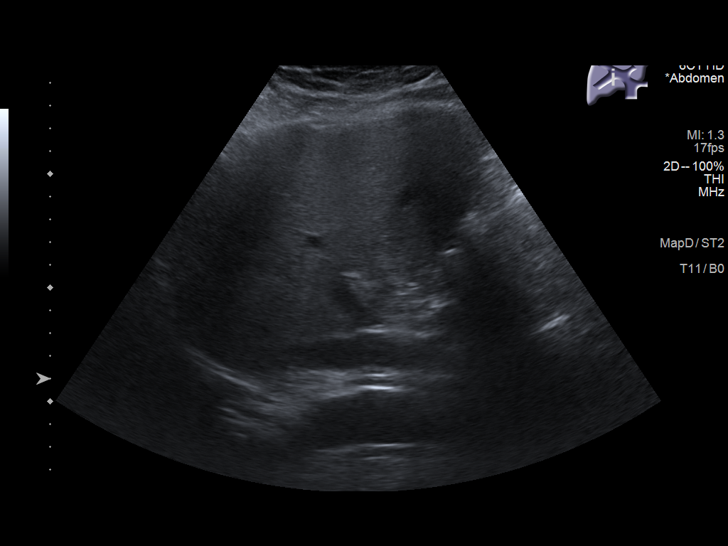
[im 31/91]
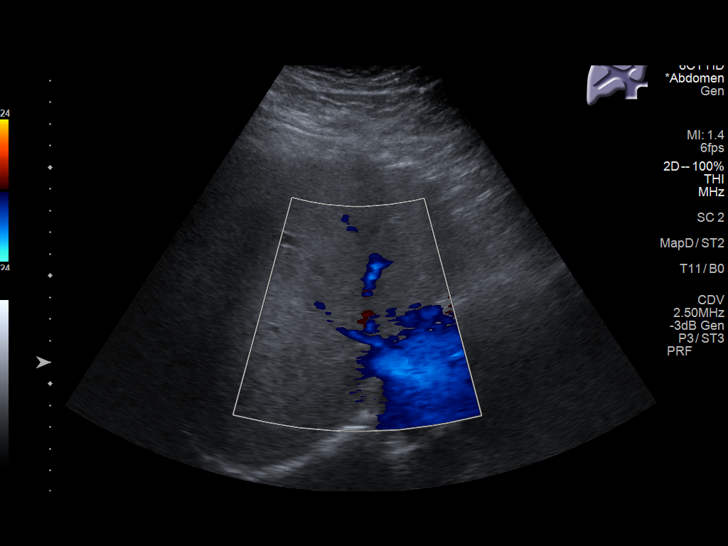
[im 34/91]
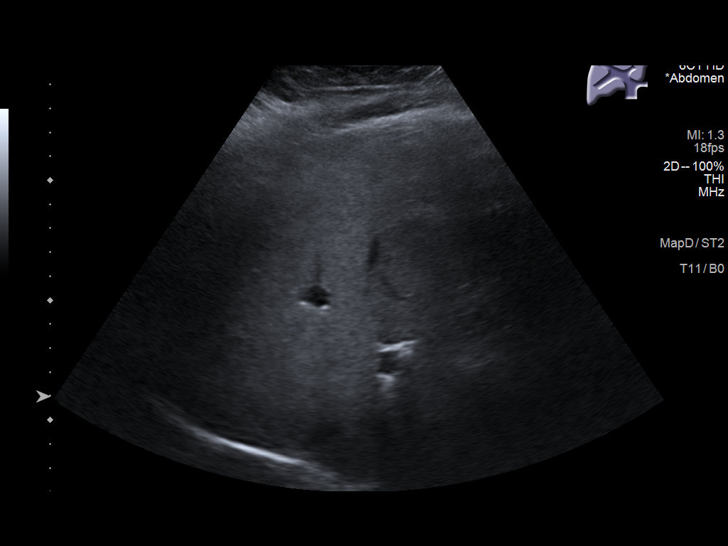
[im 42/91]
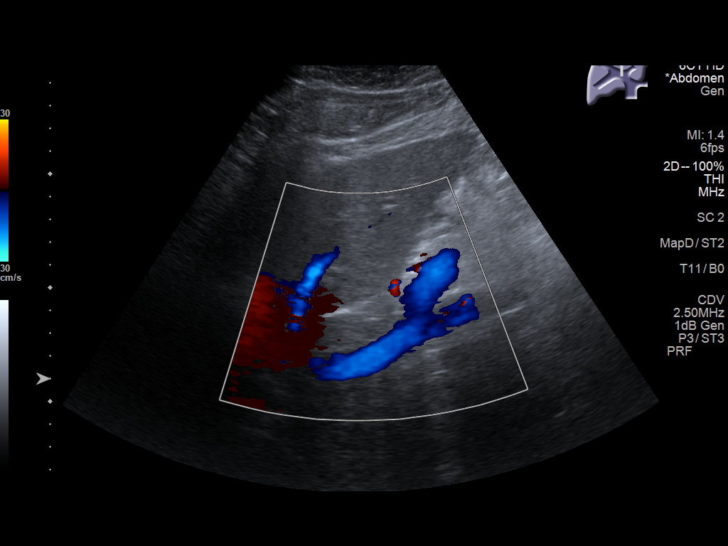
[im 49/91]
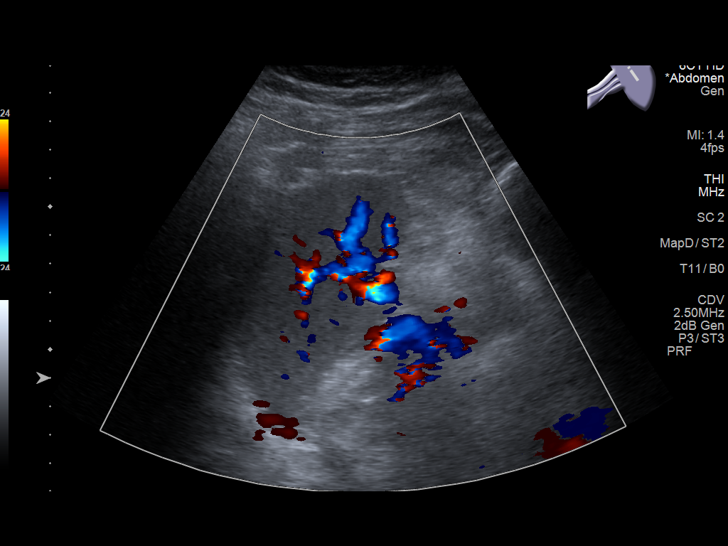
[im 57/91]
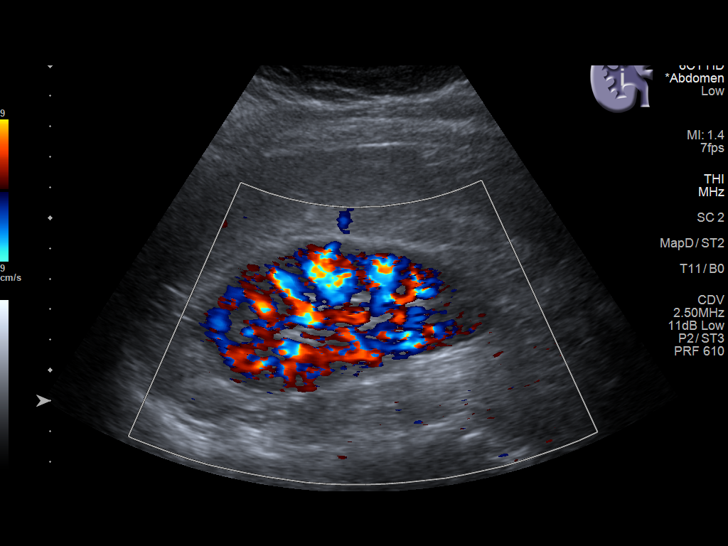
[im 61/91]
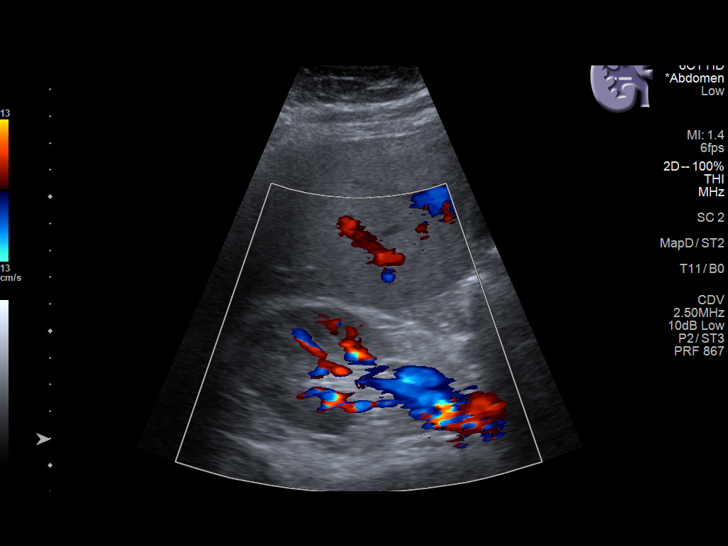
[im 68/91]
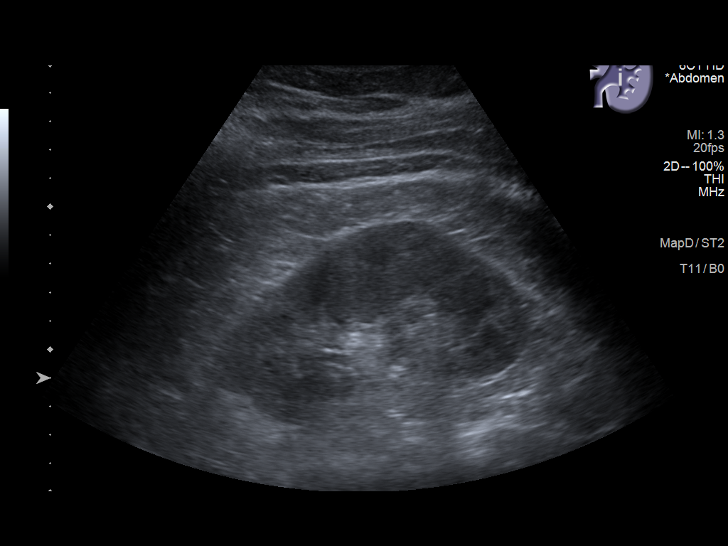
[im 76/91]
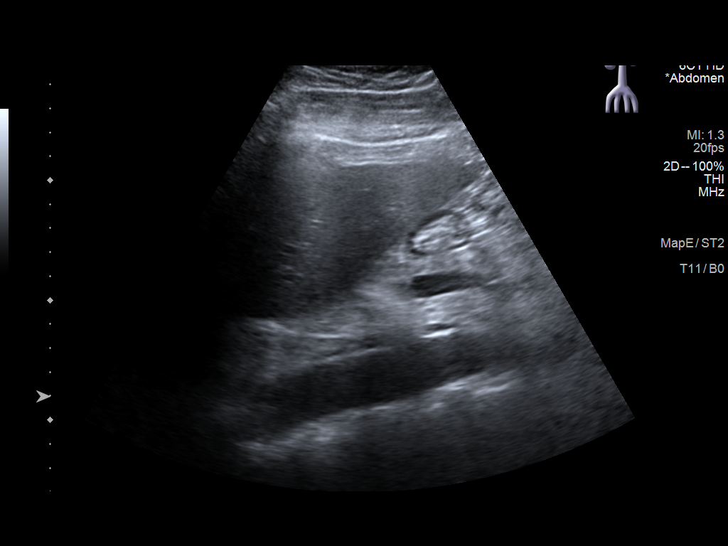
[im 83/91]
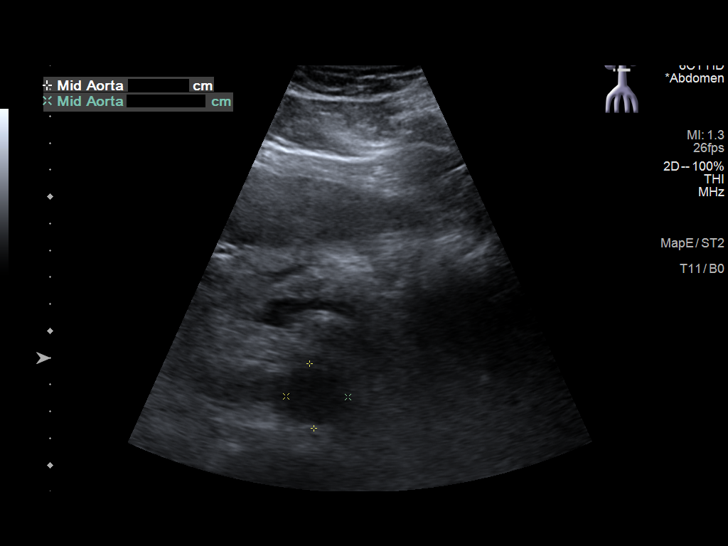
[im 91/91]
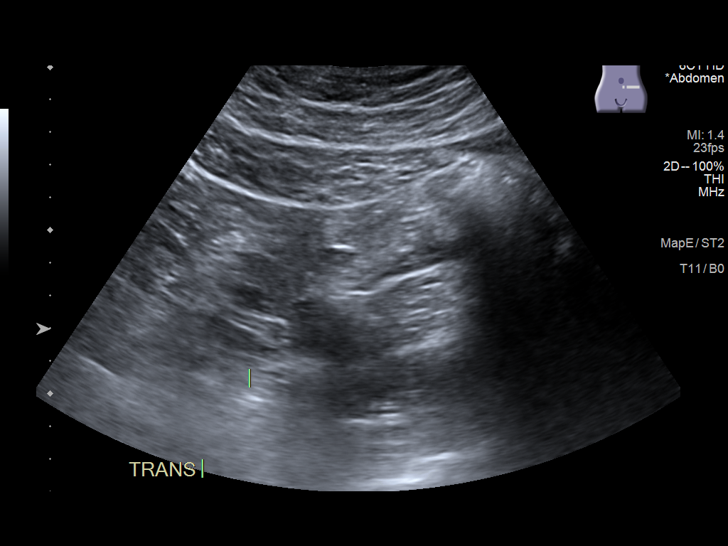

[14 of 25 positions shown; findings below may reference images not displayed]

FINDINGS: Gallbladder: No gallstones or wall thickening visualized. No
sonographic Murphy sign noted by sonographer.

Common bile duct: Diameter: Normal, 3 mm.

Liver: No focal lesion identified. Within normal limits in
parenchymal echogenicity. Portal vein is patent on color Doppler
imaging with normal direction of blood flow towards the liver.

IVC: No abnormality visualized.

Pancreas: Visualized portion unremarkable.

Spleen: Size and appearance within normal limits.

Right Kidney: Length: 10.3 cm. Echogenicity within normal limits. No
mass or hydronephrosis visualized.

Left Kidney: Length: 10.5 cm. Echogenicity within normal limits. No
mass or hydronephrosis visualized.

Abdominal aorta: No aneurysm visualized.

Other findings: No ascites. No ultrasound abnormality within the
left lower quadrant or midline periumbilical area at the site of
pain.
IMPRESSION: No acute process or explanation for abdominal pain.

## 2023-12-05 ENCOUNTER — Ambulatory Visit: Admitting: Urology

## 2023-12-05 ENCOUNTER — Encounter: Payer: Self-pay | Admitting: Urology

## 2023-12-05 VITALS — BP 129/84 | HR 111 | Ht 73.0 in | Wt 199.4 lb

## 2023-12-05 DIAGNOSIS — C61 Malignant neoplasm of prostate: Secondary | ICD-10-CM

## 2023-12-05 LAB — URINALYSIS, COMPLETE
Bilirubin, UA: NEGATIVE
Ketones, UA: NEGATIVE
Leukocytes,UA: NEGATIVE
Nitrite, UA: NEGATIVE
Protein,UA: NEGATIVE
RBC, UA: NEGATIVE
Specific Gravity, UA: 1.015 (ref 1.005–1.030)
Urobilinogen, Ur: 0.2 mg/dL (ref 0.2–1.0)
pH, UA: 6 (ref 5.0–7.5)

## 2023-12-05 LAB — MICROSCOPIC EXAMINATION

## 2023-12-05 NOTE — Addendum Note (Signed)
 Addended byBETHA CORIE PLATER on: 12/05/2023 10:34 AM   Modules accepted: Orders

## 2023-12-16 DIAGNOSIS — Z719 Counseling, unspecified: Secondary | ICD-10-CM

## 2023-12-17 ENCOUNTER — Other Ambulatory Visit: Payer: Self-pay

## 2023-12-17 ENCOUNTER — Encounter
Admission: RE | Admit: 2023-12-17 | Discharge: 2023-12-17 | Disposition: A | Discharge status: Home / Self Care | Source: Ambulatory Visit | Attending: Urology | Admitting: Urology

## 2023-12-17 DIAGNOSIS — E119 Type 2 diabetes mellitus without complications: Secondary | ICD-10-CM

## 2023-12-17 DIAGNOSIS — Z01812 Encounter for preprocedural laboratory examination: Secondary | ICD-10-CM

## 2023-12-17 DIAGNOSIS — Z01810 Encounter for preprocedural cardiovascular examination: Secondary | ICD-10-CM

## 2023-12-17 HISTORY — DX: Radiculopathy, lumbar region: M54.16

## 2023-12-17 HISTORY — DX: Pneumonia, unspecified organism: J18.9

## 2023-12-17 NOTE — Patient Instructions (Addendum)
 Your procedure is scheduled on: 12/24/23 - Tuesday Report to the Registration Desk on the 1st floor of the [facility]. To find out your arrival time, please call [phone] between 1PM - 3PM on: 12/23/23 - Monday If your arrival time is 6:00 am, do not arrive before that time as the [facility] entrance doors do not open until 6:00 am.  REMEMBER: Instructions that are not followed completely may result in serious medical risk, up to and including death; or upon the discretion of your surgeon and anesthesiologist your surgery may need to be rescheduled.  Do not eat food or drink any liquids after midnight the night before surgery.  No gum chewing or hard candies.  One week prior to surgery: Stop Anti-inflammatories (NSAIDS) such as Advil, Aleve , Ibuprofen, Motrin, Naproxen , Naprosyn  and Aspirin based products such as Excedrin, Goody's Powder, BC Powder. You may take Tylenol  if needed for pain up until the day of surgery.  Stop ANY OVER THE COUNTER supplements until after surgery.  ON THE DAY OF SURGERY ONLY TAKE THESE MEDICATIONS WITH SIPS OF WATER:  none   No Alcohol for 24 hours before or after surgery.  No Smoking including e-cigarettes for 24 hours before surgery.  No chewable tobacco products for at least 6 hours before surgery.  No nicotine patches on the day of surgery.  Do not use any recreational drugs for at least a week (preferably 2 weeks) before your surgery.  Please be advised that the combination of cocaine and anesthesia may have negative outcomes, up to and including death. If you test positive for cocaine, your surgery will be cancelled.  On the morning of surgery brush your teeth with toothpaste and water, you may rinse your mouth with mouthwash if you wish. Do not swallow any toothpaste or mouthwash.  Use CHG Soap or wipes as directed on instruction sheet.  Do not wear jewelry, make-up, hairpins, clips or nail polish.  For welded (permanent) jewelry:  bracelets, anklets, waist bands, etc.  Please have this removed prior to surgery.  If it is not removed, there is a chance that hospital personnel will need to cut it off on the day of surgery.  Do not wear lotions, powders, or perfumes.   Do not shave body hair from the neck down 48 hours before surgery.  Contact lenses, hearing aids and dentures may not be worn into surgery.  Do not bring valuables to the hospital. [facility] is not responsible for any missing/lost belongings or valuables.   Notify your doctor if there is any change in your medical condition (cold, fever, infection).  Wear comfortable clothing (specific to your surgery type) to the hospital.  After surgery, you can help prevent lung complications by doing breathing exercises.  Take deep breaths and cough every 1-2 hours. Your doctor may order a device called an Incentive Spirometer to help you take deep breaths.  When coughing or sneezing, hold a pillow firmly against your incision with both hands. This is called "splinting." Doing this helps protect your incision. It also decreases belly discomfort.  If you are being admitted to the hospital overnight, leave your suitcase in the car. After surgery it may be brought to your room.  In case of increased patient census, it may be necessary for you, the patient, to continue your postoperative care in the Same Day Surgery department.  If you are being discharged the day of surgery, you will not be allowed to drive home. You will need a  responsible individual to drive you home and stay with you for 24 hours after surgery.   If you are taking public transportation, you will need to have a responsible individual with you.  Please call the Pre-admissions Testing Dept. at [phone] if you have any questions about these instructions.  Surgery Visitation Policy:  Patients having surgery or a procedure may have two visitors.  Children under the age of 25 must have an  adult with them who is not the patient.  Inpatient Visitation:    Visiting hours are 7 a.m. to 8 p.m. Up to four visitors are allowed at one time in a patient room. The visitors may rotate out with other people during the day.  One visitor age 79 or older may stay with the patient overnight and must be in the room by 8 p.m.   Merchandiser, retail to address health-related social needs:  [email or web address]                                                                                                             Preparing for Surgery with CHLORHEXIDINE GLUCONATE (CHG) Soap  Chlorhexidine Gluconate (CHG) Soap  o An antiseptic cleaner that kills germs and bonds with the skin to continue killing germs even after washing  o Used for showering the night before surgery and morning of surgery  Before surgery, you can play an important role by reducing the number of germs on your skin.  CHG (Chlorhexidine gluconate) soap is an antiseptic cleanser which kills germs and bonds with the skin to continue killing germs even after washing.  Please do not use if you have an allergy to CHG or antibacterial soaps. If your skin becomes reddened/irritated stop using the CHG.  1. Shower the NIGHT BEFORE SURGERY and the MORNING OF SURGERY with CHG soap.  2. If you choose to wash your hair, wash your hair first as usual with your normal shampoo.  3. After shampooing, rinse your hair and body thoroughly to remove the shampoo.  4. Use CHG as you would any other liquid soap. You can apply CHG directly to the skin and wash gently with a scrungie or a clean washcloth.  5. Apply the CHG soap to your body only from the neck down. Do not use on open wounds or open sores. Avoid contact with your eyes, ears, mouth, and genitals (private parts). Wash face and genitals (private parts) with your normal soap.  6. Wash thoroughly, paying special attention to the area where your surgery will be  performed.  7. Thoroughly rinse your body with warm water.  8. Do not shower/wash with your normal soap after using and rinsing off the CHG soap.  9. Pat yourself dry with a clean towel.  10. Wear clean pajamas to bed the night before surgery.  12. Place clean sheets on your bed the night of your first shower and do not sleep with pets.  13. Shower again with the CHG soap on the day of surgery prior to arriving at the hospital.  14. Do not apply any deodorants/lotions/powders.  15. Please wear clean clothes to the hospital.  How to Use an Incentive Spirometer  An incentive spirometer is a tool that measures how well you are filling your lungs with each breath. Learning to take long, deep breaths using this tool can help you keep your lungs clear and active. This may help to reverse or lessen your chance of developing breathing (pulmonary) problems, especially infection. You may be asked to use a spirometer: After a surgery. If you have a lung problem or a history of smoking. After a long period of time when you have been unable to move or be active. If the spirometer includes an indicator to show the highest number that you have reached, your health care provider or respiratory therapist will help you set a goal. Keep a log of your progress as told by your health care provider. What are the risks? Breathing too quickly may cause dizziness or cause you to pass out. Take your time so you do not get dizzy or light-headed. If you are in pain, you may need to take pain medicine before doing incentive spirometry. It is harder to take a deep breath if you are having pain. How to use your incentive spirometer  Sit up on the edge of your bed or on a chair. Hold the incentive spirometer so that it is in an upright position. Before you use the spirometer, breathe out normally. Place the mouthpiece in your mouth. Make sure your lips are closed tightly around it. Breathe in slowly and as deeply  as you can through your mouth, causing the piston or the ball to rise toward the top of the chamber. Hold your breath for 3-5 seconds, or for as long as possible. If the spirometer includes a coach indicator, use this to guide you in breathing. Slow down your breathing if the indicator goes above the marked areas. Remove the mouthpiece from your mouth and breathe out normally. The piston or ball will return to the bottom of the chamber. Rest for a few seconds, then repeat the steps 10 or more times. Take your time and take a few normal breaths between deep breaths so that you do not get dizzy or light-headed. Do this every 1-2 hours when you are awake. If the spirometer includes a goal marker to show the highest number you have reached (best effort), use this as a goal to work toward during each repetition. After each set of 10 deep breaths, cough a few times. This will help to make sure that your lungs are clear. If you have an incision on your chest or abdomen from surgery, place a pillow or a rolled-up towel firmly against the incision when you cough. This can help to reduce pain while taking deep breaths and coughing. General tips When you are able to get out of bed: Walk around often. Continue to take deep breaths and cough in order to clear your lungs. Keep using the incentive spirometer until your health care provider says it is okay to stop using it. If you have been in the hospital, you may be told to keep using the spirometer at home. Contact a health care provider if: You are having difficulty using the spirometer. You have trouble using the spirometer as often as instructed. Your pain medicine is not giving enough relief for you to use the spirometer as told. You have a fever. Get help right away if: You develop shortness of breath. You develop  a cough with bloody mucus from the lungs. You have fluid or blood coming from an incision site after you cough. Summary An incentive  spirometer is a tool that can help you learn to take long, deep breaths to keep your lungs clear and active. You may be asked to use a spirometer after a surgery, if you have a lung problem or a history of smoking, or if you have been inactive for a long period of time. Use your incentive spirometer as instructed every 1-2 hours while you are awake. If you have an incision on your chest or abdomen, place a pillow or a rolled-up towel firmly against your incision when you cough. This will help to reduce pain. Get help right away if you have shortness of breath, you cough up bloody mucus, or blood comes from your incision when you cough. This information is not intended to replace advice given to you by your health care provider. Make sure you discuss any questions you have with your health care provider. Document Revised: 05/25/2019 Document Reviewed: 05/25/2019 Elsevier Patient Education  2023 [employer].

## 2023-12-19 ENCOUNTER — Encounter
Admission: RE | Admit: 2023-12-19 | Discharge: 2023-12-19 | Disposition: A | Discharge status: Home / Self Care | Source: Ambulatory Visit | Attending: Urology | Admitting: Urology

## 2023-12-19 DIAGNOSIS — Z01810 Encounter for preprocedural cardiovascular examination: Secondary | ICD-10-CM

## 2023-12-19 DIAGNOSIS — E119 Type 2 diabetes mellitus without complications: Secondary | ICD-10-CM | POA: Diagnosis not present

## 2023-12-19 DIAGNOSIS — C61 Malignant neoplasm of prostate: Secondary | ICD-10-CM | POA: Insufficient documentation

## 2023-12-19 DIAGNOSIS — Z01818 Encounter for other preprocedural examination: Secondary | ICD-10-CM | POA: Diagnosis present

## 2023-12-19 LAB — CBC
HCT: 39.6 % (ref 39.0–52.0)
Hemoglobin: 13 g/dL (ref 13.0–17.0)
MCH: 27.2 pg (ref 26.0–34.0)
MCHC: 32.8 g/dL (ref 30.0–36.0)
MCV: 82.8 fL (ref 80.0–100.0)
Platelets: 185 K/uL (ref 150–400)
RBC: 4.78 MIL/uL (ref 4.22–5.81)
RDW: 14.2 % (ref 11.5–15.5)
WBC: 4.6 K/uL (ref 4.0–10.5)
nRBC: 0 % (ref 0.0–0.2)

## 2023-12-19 LAB — URINALYSIS, COMPLETE (UACMP) WITH MICROSCOPIC
Bilirubin Urine: NEGATIVE
Glucose, UA: 150 mg/dL — AB
Hgb urine dipstick: NEGATIVE
Ketones, ur: NEGATIVE mg/dL
Leukocytes,Ua: NEGATIVE
Nitrite: NEGATIVE
Protein, ur: NEGATIVE mg/dL
Specific Gravity, Urine: 1.015 (ref 1.005–1.030)
Squamous Epithelial / HPF: 0 /HPF (ref 0–5)
pH: 6 (ref 5.0–8.0)

## 2023-12-19 LAB — APTT: aPTT: 30 s (ref 24–36)

## 2023-12-19 LAB — TYPE AND SCREEN
ABO/RH(D): A POS
Antibody Screen: NEGATIVE

## 2023-12-19 LAB — BASIC METABOLIC PANEL WITH GFR
Anion gap: 11 (ref 5–15)
BUN: 26 mg/dL — ABNORMAL HIGH (ref 6–20)
CO2: 22 mmol/L (ref 22–32)
Calcium: 12.1 mg/dL — ABNORMAL HIGH (ref 8.9–10.3)
Chloride: 103 mmol/L (ref 98–111)
Creatinine, Ser: 1.58 mg/dL — ABNORMAL HIGH (ref 0.61–1.24)
GFR, Estimated: 50 mL/min — ABNORMAL LOW (ref >60)
Glucose, Bld: 137 mg/dL — ABNORMAL HIGH (ref 70–99)
Potassium: 3.7 mmol/L (ref 3.5–5.1)
Sodium: 136 mmol/L (ref 135–145)

## 2023-12-19 LAB — PROTIME-INR
INR: 1.1 (ref 0.8–1.2)
Prothrombin Time: 14.5 s (ref 11.4–15.2)

## 2023-12-19 NOTE — Progress Notes (Signed)
  [facility] Perioperative Services: Pre-Admission/Anesthesia Testing  Abnormal Lab Notification   Date: 12/19/23  Name: Alexander Deleon MRN:   [id number]  Re: Abnormal labs noted during PAT appointment   Notified:    Provider Name Provider Role Notification Mode  Georganne Penne SAUNDERS, MD Urology (surgeon) Routed and/or faxed via [facility]   ABNORMAL LAB VALUE(S):   Lab Results  Component Value Date   BUN 26 (H) 12/19/2023   CREATININE 1.58 (H) 12/19/2023   CALCIUM 12.1 (H) 12/19/2023   Clinical Information and Notes:  [name] is scheduled for a PROSTATECTOMY, RADICAL, ROBOT-ASSISTED, LAPAROSCOPIC; LAPAROSCOPIC TOTAL PELVIC LYMPHADENECTOMY (Bilateral) on 12/24/2023.   In review of his medication list, patient is not on any type of supplemental calcium.  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  Query potential calcium elevation related to osseous metastasis vs. possibility of potential malignancy related secretion of PTHrP.    Mildly decreased renal function today; BUN 26 mg/dL with a creatinine of 8.41 mg/dL.  EKG showed ST with no ST/T wave changes at a rate of 103 bpm. QTc normal at 427 ms.   Previous Ca2+ 10.8 mg/dL on 91/72/7974. Alkaline phosphatase was also normal at that time.   PSA markedly elevated at 16.35 ng/mL on 11/13/2023.   Sending to primary attending surgeon for review. Patient will require follow up and monitoring in order to safely follow his elevated Ca2+ levels. We were not anticipating patient to be hypercalcemic, therefore additional lab tubes were not collected for add-on testing. Some of this standard testing has special considerations with regards to collection time, collection to processing times, and can even require specimen to be frozen. Will defer further testing/workup to treatment team as deemed appropriate.   No further needs from the PAT department  identified at this time.  Dorise Pereyra, MSN, APRN, FNP-C, CEN [facility]  Perioperative Services Nurse Practitioner Phone: [phone] Fax: [phone] 12/19/23 6:49 PM

## 2023-12-20 LAB — URINE CULTURE: Culture: NO GROWTH

## 2023-12-24 ENCOUNTER — Encounter
Admission: RE | Disposition: A | Discharge status: Home / Self Care | Payer: Self-pay | Source: Home / Self Care | Attending: Urology

## 2023-12-24 ENCOUNTER — Encounter: Payer: Self-pay | Admitting: Urology

## 2023-12-24 ENCOUNTER — Ambulatory Visit: Payer: Self-pay | Admitting: Urgent Care

## 2023-12-24 ENCOUNTER — Other Ambulatory Visit: Payer: Self-pay

## 2023-12-24 ENCOUNTER — Observation Stay
Admission: RE | Admit: 2023-12-24 | Discharge: 2023-12-25 | Disposition: A | Discharge status: Home / Self Care | Attending: Urology | Admitting: Urology

## 2023-12-24 ENCOUNTER — Ambulatory Visit

## 2023-12-24 DIAGNOSIS — C61 Malignant neoplasm of prostate: Secondary | ICD-10-CM

## 2023-12-24 DIAGNOSIS — E119 Type 2 diabetes mellitus without complications: Secondary | ICD-10-CM | POA: Diagnosis not present

## 2023-12-24 DIAGNOSIS — I10 Essential (primary) hypertension: Secondary | ICD-10-CM | POA: Diagnosis not present

## 2023-12-24 DIAGNOSIS — Z79899 Other long term (current) drug therapy: Secondary | ICD-10-CM | POA: Diagnosis not present

## 2023-12-24 DIAGNOSIS — C775 Secondary and unspecified malignant neoplasm of intrapelvic lymph nodes: Secondary | ICD-10-CM | POA: Diagnosis not present

## 2023-12-24 DIAGNOSIS — Z7984 Long term (current) use of oral hypoglycemic drugs: Secondary | ICD-10-CM | POA: Diagnosis not present

## 2023-12-24 DIAGNOSIS — Z01812 Encounter for preprocedural laboratory examination: Secondary | ICD-10-CM

## 2023-12-24 DIAGNOSIS — I888 Other nonspecific lymphadenitis: Secondary | ICD-10-CM | POA: Insufficient documentation

## 2023-12-24 LAB — CBC
HCT: 36.8 % — ABNORMAL LOW (ref 39.0–52.0)
Hemoglobin: 12.2 g/dL — ABNORMAL LOW (ref 13.0–17.0)
MCH: 27.5 pg (ref 26.0–34.0)
MCHC: 33.2 g/dL (ref 30.0–36.0)
MCV: 82.9 fL (ref 80.0–100.0)
Platelets: 164 K/uL (ref 150–400)
RBC: 4.44 MIL/uL (ref 4.22–5.81)
RDW: 14.1 % (ref 11.5–15.5)
WBC: 7.2 K/uL (ref 4.0–10.5)
nRBC: 0 % (ref 0.0–0.2)

## 2023-12-24 LAB — ABO/RH: ABO/RH(D): A POS

## 2023-12-24 LAB — GLUCOSE, CAPILLARY
Glucose-Capillary: 112 mg/dL — ABNORMAL HIGH (ref 70–99)
Glucose-Capillary: 158 mg/dL — ABNORMAL HIGH (ref 70–99)

## 2023-12-24 LAB — CREATININE, SERUM
Creatinine, Ser: 1.26 mg/dL — ABNORMAL HIGH (ref 0.61–1.24)
GFR, Estimated: >60 mL/min (ref >60)

## 2023-12-24 SURGERY — PROSTATECTOMY, RADICAL, ROBOT-ASSISTED, LAPAROSCOPIC
Anesthesia: General

## 2023-12-24 MED ORDER — CEFAZOLIN SODIUM-DEXTROSE 2-4 GM/100ML-% IV SOLN
2.0000 g | INTRAVENOUS | Status: AC
  Administered 2023-12-24: 2 g via INTRAVENOUS

## 2023-12-24 MED ORDER — DEXAMETHASONE SODIUM PHOSPHATE 10 MG/ML IJ SOLN
INTRAMUSCULAR | Status: DC | PRN
  Administered 2023-12-24: 10 mg via INTRAVENOUS

## 2023-12-24 MED ORDER — DEXAMETHASONE SODIUM PHOSPHATE 10 MG/ML IJ SOLN
INTRAMUSCULAR | Status: AC
  Filled 2023-12-24: qty 1

## 2023-12-24 MED ORDER — PHENYLEPHRINE 80 MCG/ML (10ML) SYRINGE FOR IV PUSH (FOR BLOOD PRESSURE SUPPORT)
PREFILLED_SYRINGE | INTRAVENOUS | Status: AC
  Filled 2023-12-24: qty 10

## 2023-12-24 MED ORDER — LINAGLIPTIN 5 MG PO TABS
5.0000 mg | ORAL_TABLET | Freq: Every day | ORAL | Status: DC
  Administered 2023-12-24 – 2023-12-25 (×2): 5 mg via ORAL

## 2023-12-24 MED ORDER — HEPARIN SODIUM (PORCINE) 5000 UNIT/ML IJ SOLN
5000.0000 [IU] | Freq: Three times a day (TID) | INTRAMUSCULAR | Status: DC
  Administered 2023-12-24 – 2023-12-25 (×2): 5000 [IU] via SUBCUTANEOUS
  Filled 2023-12-24 (×2): qty 1

## 2023-12-24 MED ORDER — CHLORHEXIDINE GLUCONATE 0.12 % MT SOLN
OROMUCOSAL | Status: AC
  Filled 2023-12-24: qty 15

## 2023-12-24 MED ORDER — PROPOFOL 10 MG/ML IV BOLUS
INTRAVENOUS | Status: DC | PRN
  Administered 2023-12-24: 150 mg via INTRAVENOUS
  Administered 2023-12-24: 150 ug/kg/min via INTRAVENOUS
  Administered 2023-12-24: 125 ug/kg/min via INTRAVENOUS

## 2023-12-24 MED ORDER — FENTANYL CITRATE (PF) 100 MCG/2ML IJ SOLN
INTRAMUSCULAR | Status: AC
  Filled 2023-12-24: qty 2

## 2023-12-24 MED ORDER — CHLORHEXIDINE GLUCONATE 4 % EX SOLN: 60.0000 mL | Freq: Once | CUTANEOUS | Status: DC

## 2023-12-24 MED ORDER — HYDROMORPHONE HCL 1 MG/ML IJ SOLN
0.2500 mg | INTRAMUSCULAR | Status: DC | PRN
  Administered 2023-12-24 (×2): 0.5 mg via INTRAVENOUS

## 2023-12-24 MED ORDER — MIDAZOLAM HCL 2 MG/2ML IJ SOLN
INTRAMUSCULAR | Status: AC
  Filled 2023-12-24: qty 2

## 2023-12-24 MED ORDER — CHLORHEXIDINE GLUCONATE 0.12 % MT SOLN
15.0000 mL | Freq: Once | OROMUCOSAL | Status: AC
  Administered 2023-12-24: 15 mL via OROMUCOSAL

## 2023-12-24 MED ORDER — GLYCOPYRROLATE 0.2 MG/ML IJ SOLN
INTRAMUSCULAR | Status: AC
  Filled 2023-12-24: qty 1

## 2023-12-24 MED ORDER — MECLIZINE HCL 25 MG PO TABS
25.0000 mg | ORAL_TABLET | Freq: Three times a day (TID) | ORAL | Status: DC | PRN
Start: 2023-12-24 — End: 2023-12-25

## 2023-12-24 MED ORDER — LINAGLIPTIN 5 MG PO TABS
ORAL_TABLET | ORAL | Status: AC
  Filled 2023-12-24: qty 1

## 2023-12-24 MED ORDER — SITAGLIPTIN 100 MG PO TABS: 100.0000 mg | ORAL_TABLET | Freq: Every day | ORAL | Status: DC

## 2023-12-24 MED ORDER — BUPIVACAINE HCL (PF) 0.5 % IJ SOLN
INTRAMUSCULAR | Status: AC
  Filled 2023-12-24: qty 30

## 2023-12-24 MED ORDER — PROPOFOL 1000 MG/100ML IV EMUL
INTRAVENOUS | Status: AC
Start: 2023-12-24 — End: 2023-12-24
  Filled 2023-12-24: qty 100

## 2023-12-24 MED ORDER — PHENYLEPHRINE 80 MCG/ML (10ML) SYRINGE FOR IV PUSH (FOR BLOOD PRESSURE SUPPORT)
PREFILLED_SYRINGE | INTRAVENOUS | Status: DC | PRN
Start: 2023-12-24 — End: 2023-12-24
  Administered 2023-12-24 (×2): 80 ug via INTRAVENOUS

## 2023-12-24 MED ORDER — BUPIVACAINE HCL (PF) 0.5 % IJ SOLN
INTRAMUSCULAR | Status: AC
Start: 2023-12-24 — End: 2023-12-24
  Filled 2023-12-24: qty 30

## 2023-12-24 MED ORDER — LIDOCAINE HCL (PF) 2 % IJ SOLN
INTRAMUSCULAR | Status: AC
  Filled 2023-12-24: qty 5

## 2023-12-24 MED ORDER — OXYBUTYNIN CHLORIDE ER 5 MG PO TB24
10.0000 mg | ORAL_TABLET | Freq: Every day | ORAL | Status: DC
  Administered 2023-12-24 – 2023-12-25 (×2): 10 mg via ORAL
  Filled 2023-12-24 (×2): qty 2

## 2023-12-24 MED ORDER — SODIUM CHLORIDE 0.9 % IV SOLN: INTRAVENOUS | Status: DC

## 2023-12-24 MED ORDER — ROCURONIUM BROMIDE 10 MG/ML (PF) SYRINGE
PREFILLED_SYRINGE | INTRAVENOUS | Status: AC
  Filled 2023-12-24: qty 10

## 2023-12-24 MED ORDER — CEFAZOLIN SODIUM-DEXTROSE 2-4 GM/100ML-% IV SOLN
INTRAVENOUS | Status: AC
Start: 2023-12-24 — End: 2023-12-24
  Filled 2023-12-24: qty 100

## 2023-12-24 MED ORDER — GLYCOPYRROLATE 0.2 MG/ML IJ SOLN
INTRAMUSCULAR | Status: DC | PRN
  Administered 2023-12-24: .2 mg via INTRAVENOUS

## 2023-12-24 MED ORDER — ACETAMINOPHEN 10 MG/ML IV SOLN
INTRAVENOUS | Status: DC | PRN
  Administered 2023-12-24: 1000 mg via INTRAVENOUS

## 2023-12-24 MED ORDER — ROCURONIUM BROMIDE 100 MG/10ML IV SOLN
INTRAVENOUS | Status: DC | PRN
  Administered 2023-12-24 (×2): 50 mg via INTRAVENOUS
  Administered 2023-12-24: 20 mg via INTRAVENOUS
  Administered 2023-12-24 (×2): 30 mg via INTRAVENOUS

## 2023-12-24 MED ORDER — SODIUM CHLORIDE 0.9 % IR SOLN: Status: DC | PRN

## 2023-12-24 MED ORDER — VISTASEAL 4 ML SINGLE DOSE KIT
PACK | CUTANEOUS | Status: AC
  Filled 2023-12-24: qty 4

## 2023-12-24 MED ORDER — ORAL CARE MOUTH RINSE: 15.0000 mL | Freq: Once | OROMUCOSAL | Status: AC

## 2023-12-24 MED ORDER — LIDOCAINE HCL (CARDIAC) PF 100 MG/5ML IV SOSY
PREFILLED_SYRINGE | INTRAVENOUS | Status: DC | PRN
  Administered 2023-12-24: 100 mg via INTRAVENOUS

## 2023-12-24 MED ORDER — ROCURONIUM BROMIDE 10 MG/ML (PF) SYRINGE
PREFILLED_SYRINGE | INTRAVENOUS | Status: AC
Start: 2023-12-24 — End: 2023-12-24
  Filled 2023-12-24: qty 10

## 2023-12-24 MED ORDER — HEPARIN SODIUM (PORCINE) 5000 UNIT/ML IJ SOLN
INTRAMUSCULAR | Status: AC
  Filled 2023-12-24: qty 1

## 2023-12-24 MED ORDER — DEXAMETHASONE SODIUM PHOSPHATE 10 MG/ML IJ SOLN
INTRAMUSCULAR | Status: AC
Start: 2023-12-24 — End: 2023-12-24
  Filled 2023-12-24: qty 1

## 2023-12-24 MED ORDER — 0.9 % SODIUM CHLORIDE (POUR BTL) OPTIME: TOPICAL | Status: DC | PRN

## 2023-12-24 MED ORDER — BUPIVACAINE LIPOSOME 1.3 % IJ SUSP
INTRAMUSCULAR | Status: AC
  Filled 2023-12-24: qty 20

## 2023-12-24 MED ORDER — NAPHAZOLINE-PHENIRAMINE 0.025-0.3 % OP SOLN: 1.0000 [drp] | Freq: Four times a day (QID) | OPHTHALMIC | Status: DC | PRN

## 2023-12-24 MED ORDER — HEMOSTATIC AGENTS (NO CHARGE) OPTIME
TOPICAL | Status: DC | PRN
  Administered 2023-12-24: 1

## 2023-12-24 MED ORDER — OXYCODONE HCL 5 MG/5ML PO SOLN: 5.0000 mg | Freq: Once | ORAL | Status: DC | PRN

## 2023-12-24 MED ORDER — ACETAMINOPHEN 10 MG/ML IV SOLN
INTRAVENOUS | Status: AC
  Filled 2023-12-24: qty 100

## 2023-12-24 MED ORDER — BUPIVACAINE LIPOSOME 1.3 % IJ SUSP
INTRAMUSCULAR | Status: DC | PRN
  Administered 2023-12-24: 50 mL via INTRAMUSCULAR

## 2023-12-24 MED ORDER — OXYCODONE HCL 5 MG PO TABS
5.0000 mg | ORAL_TABLET | ORAL | Status: DC | PRN
  Filled 2023-12-24: qty 1

## 2023-12-24 MED ORDER — ONDANSETRON HCL 4 MG/2ML IJ SOLN
INTRAMUSCULAR | Status: DC | PRN
  Administered 2023-12-24: 4 mg via INTRAVENOUS

## 2023-12-24 MED ORDER — ONDANSETRON HCL 4 MG/2ML IJ SOLN
INTRAMUSCULAR | Status: AC
  Filled 2023-12-24: qty 2

## 2023-12-24 MED ORDER — SENNOSIDES-DOCUSATE SODIUM 8.6-50 MG PO TABS
2.0000 | ORAL_TABLET | Freq: Every day | ORAL | Status: DC
  Administered 2023-12-24: 2 via ORAL
  Filled 2023-12-24: qty 2

## 2023-12-24 MED ORDER — FENTANYL CITRATE (PF) 100 MCG/2ML IJ SOLN
INTRAMUSCULAR | Status: DC | PRN
  Administered 2023-12-24 (×4): 50 ug via INTRAVENOUS

## 2023-12-24 MED ORDER — OXYCODONE HCL 5 MG PO TABS: 5.0000 mg | ORAL_TABLET | Freq: Once | ORAL | Status: DC | PRN

## 2023-12-24 MED ORDER — HYDROMORPHONE HCL 1 MG/ML IJ SOLN
INTRAMUSCULAR | Status: AC
  Filled 2023-12-24: qty 1

## 2023-12-24 MED ORDER — VISTASEAL 4 ML SINGLE DOSE KIT
PACK | CUTANEOUS | Status: DC | PRN
  Administered 2023-12-24: 4 mL via TOPICAL

## 2023-12-24 MED ORDER — ONDANSETRON HCL 4 MG/2ML IJ SOLN: 4.0000 mg | INTRAMUSCULAR | Status: DC | PRN

## 2023-12-24 MED ORDER — STERILE WATER FOR IRRIGATION IR SOLN
Status: DC | PRN
  Administered 2023-12-24: 3000 mL

## 2023-12-24 MED ORDER — MIDAZOLAM HCL 2 MG/2ML IJ SOLN
INTRAMUSCULAR | Status: DC | PRN
  Administered 2023-12-24: 2 mg via INTRAVENOUS

## 2023-12-24 MED ORDER — SUGAMMADEX SODIUM 200 MG/2ML IV SOLN
INTRAVENOUS | Status: DC | PRN
  Administered 2023-12-24: 200 mg via INTRAVENOUS

## 2023-12-24 MED ORDER — PROPOFOL 1000 MG/100ML IV EMUL
INTRAVENOUS | Status: AC
  Filled 2023-12-24: qty 100

## 2023-12-24 MED ORDER — FIBRIN SEALANT 2 ML SINGLE DOSE KIT
PACK | CUTANEOUS | Status: AC
Start: 2023-12-24 — End: 2023-12-24
  Filled 2023-12-24: qty 2

## 2023-12-24 MED ORDER — HEPARIN SODIUM (PORCINE) 5000 UNIT/ML IJ SOLN
5000.0000 [IU] | Freq: Once | INTRAMUSCULAR | Status: AC
Start: 2023-12-24 — End: 2023-12-24
  Administered 2023-12-24: 5000 [IU] via SUBCUTANEOUS

## 2023-12-24 MED ORDER — ACETAMINOPHEN 500 MG PO TABS
1000.0000 mg | ORAL_TABLET | Freq: Four times a day (QID) | ORAL | Status: DC
  Administered 2023-12-24 – 2023-12-25 (×3): 1000 mg via ORAL
  Filled 2023-12-24 (×3): qty 2

## 2023-12-24 MED ORDER — IRBESARTAN 150 MG PO TABS
150.0000 mg | ORAL_TABLET | Freq: Every day | ORAL | Status: DC
  Administered 2023-12-24 – 2023-12-25 (×2): 150 mg via ORAL
  Filled 2023-12-24 (×3): qty 1

## 2023-12-24 MED ORDER — KETOROLAC TROMETHAMINE 30 MG/ML IJ SOLN
INTRAMUSCULAR | Status: AC
Start: 2023-12-24 — End: 2023-12-24
  Filled 2023-12-24: qty 1

## 2023-12-24 MED ORDER — SURGIFLO WITH THROMBIN (HEMOSTATIC MATRIX KIT) OPTIME
TOPICAL | Status: DC | PRN
  Administered 2023-12-24: 1

## 2023-12-24 SURGICAL SUPPLY — 71 items
APPLICATOR ARISTA FLEXITIP XL (MISCELLANEOUS) ×2 IMPLANT
APPLICATOR SURGIFLO ENDO (HEMOSTASIS) ×2 IMPLANT
APPLICATOR VISTASEAL FLEXIBLE (MISCELLANEOUS) IMPLANT
BAG LAPAROSCOPIC 12 15 PORT 16 (BASKET) ×2 IMPLANT
BAG URINE DRAIN 2000ML AR STRL (UROLOGICAL SUPPLIES) ×2 IMPLANT
BLADE CLIPPER SURG (BLADE) ×2 IMPLANT
CATH FOLEY 2WAY 5CC 20FR SIL (CATHETERS) IMPLANT
CATH FOLEY 2WAY SIL 18X30 (CATHETERS) ×2 IMPLANT
CATH URTH 16FR FL 2W BLN LF (CATHETERS) IMPLANT
CLIP APPLIE 5 13 M/L LIGAMAX5 (MISCELLANEOUS) IMPLANT
CLIP APPLIE XI LRG REUSE DVNC (INSTRUMENTS) IMPLANT
CLIP LIGATING HEM O LOK PURPLE (MISCELLANEOUS) ×8 IMPLANT
COVER TIP SHEARS 8 DVNC (MISCELLANEOUS) ×2 IMPLANT
CUTTER FLEX LINEAR 45M (STAPLE) IMPLANT
DEFOGGER SCOPE WARM SEASHARP (MISCELLANEOUS) ×2 IMPLANT
DERMABOND ADVANCED .7 DNX12 (GAUZE/BANDAGES/DRESSINGS) ×2 IMPLANT
DRAPE ARM DVNC X/XI (DISPOSABLE) ×8 IMPLANT
DRAPE COLUMN DVNC XI (DISPOSABLE) ×2 IMPLANT
DRAPE INCISE IOBAN 66X45 STRL (DRAPES) ×2 IMPLANT
DRAPE SHEET LG 3/4 BI-LAMINATE (DRAPES) ×2 IMPLANT
DRAPE UTILITY 15X26 TOWEL STRL (DRAPES) ×4 IMPLANT
DRIVER NDL LRG 8 DVNC XI (INSTRUMENTS) ×2 IMPLANT
DRIVER NDLE LRG 8 DVNC XI (INSTRUMENTS) ×2 IMPLANT
DRSG TELFA 3X8 NADH STRL (GAUZE/BANDAGES/DRESSINGS) IMPLANT
ELECTRODE REM PT RTRN 9FT ADLT (ELECTROSURGICAL) ×2 IMPLANT
FORCEPS BPLR 8 MD DVNC XI (FORCEP) ×2 IMPLANT
FORCEPS BPLR FENES DVNC XI (FORCEP) ×2 IMPLANT
FORCEPS PROGRASP DVNC XI (FORCEP) ×2 IMPLANT
GLOVE BIOGEL PI IND STRL 7.0 (GLOVE) ×4 IMPLANT
GOWN STRL REUS W/ TWL LRG LVL3 (GOWN DISPOSABLE) ×6 IMPLANT
GOWN STRL REUS W/ TWL XL LVL3 (GOWN DISPOSABLE) ×2 IMPLANT
GRASPER SUT TROCAR 14GX15 (MISCELLANEOUS) ×2 IMPLANT
HEMOSTAT SURGICEL 2X14 (HEMOSTASIS) IMPLANT
HOLDER FOLEY CATH W/STRAP (MISCELLANEOUS) ×2 IMPLANT
IRRIGATION STRYKERFLOW (MISCELLANEOUS) ×2 IMPLANT
KIT PINK PAD W/HEAD ARM REST (MISCELLANEOUS) ×2 IMPLANT
LABEL OR SOLS (LABEL) ×2 IMPLANT
MANIFOLD NEPTUNE II (INSTRUMENTS) ×2 IMPLANT
MARKER SKIN DUAL TIP RULER LAB (MISCELLANEOUS) ×2 IMPLANT
NDL HYPO 22X1.5 SAFETY MO (MISCELLANEOUS) ×2 IMPLANT
NDL INSUFFLATION 14GA 120MM (NEEDLE) ×2 IMPLANT
NEEDLE HYPO 22X1.5 SAFETY MO (MISCELLANEOUS) ×2 IMPLANT
NEEDLE INSUFFLATION 14GA 120MM (NEEDLE) ×2 IMPLANT
OBTURATOR OPTICALSTD 8 DVNC (TROCAR) ×2 IMPLANT
PACK LAP CHOLECYSTECTOMY (MISCELLANEOUS) ×2 IMPLANT
RELOAD STAPLE 45 2.6 WHT THIN (STAPLE) IMPLANT
SCISSORS MNPLR CVD DVNC XI (INSTRUMENTS) ×2 IMPLANT
SEAL UNIV 5-12 XI (MISCELLANEOUS) ×8 IMPLANT
SET TUBE SMOKE EVAC HIGH FLOW (TUBING) ×2 IMPLANT
SOLUTION ELECTROSURG ANTI STCK (MISCELLANEOUS) ×2 IMPLANT
SPONGE T-LAP 4X18 ~~LOC~~+RFID (SPONGE) ×2 IMPLANT
STAPLER SKIN PROX 35W (STAPLE) IMPLANT
STRAP SAFETY 5IN WIDE (MISCELLANEOUS) ×4 IMPLANT
SURGIFLO W/THROMBIN 8M KIT (HEMOSTASIS) ×2 IMPLANT
SURGILUBE 2OZ TUBE FLIPTOP (MISCELLANEOUS) ×2 IMPLANT
SUT SILK 2 0 SH (SUTURE) IMPLANT
SUT STRATA 3-0 15 RB-1 (SUTURE) IMPLANT
SUT STRATAFIX MONO 3-0 16 (SUTURE) ×4 IMPLANT
SUT VIC AB 0 CT1 36 (SUTURE) ×2 IMPLANT
SUT VIC AB 2-0 SH 27XBRD (SUTURE) IMPLANT
SUT VIC AB 3-0 SH 27X BRD (SUTURE) IMPLANT
SUT VICRYL 0 UR6 27IN ABS (SUTURE) ×4 IMPLANT
SUTURE MNCRL 4-0 27XMF (SUTURE) ×4 IMPLANT
SYR 10ML LL (SYRINGE) ×2 IMPLANT
SYRINGE TOOMEY IRRIG 70ML (MISCELLANEOUS) IMPLANT
TAPE CLOTH 3X10 WHT NS LF (GAUZE/BANDAGES/DRESSINGS) ×4 IMPLANT
TRAP FLUID SMOKE EVACUATOR (MISCELLANEOUS) ×2 IMPLANT
TROCAR Z-THREAD FIOS 12X100MM (TROCAR) ×2 IMPLANT
TROCAR Z-THREAD FIOS 5X100MM (TROCAR) ×2 IMPLANT
WATER STERILE IRR 3000ML UROMA (IV SOLUTION) ×2 IMPLANT
WATER STERILE IRR 500ML POUR (IV SOLUTION) ×2 IMPLANT

## 2023-12-24 NOTE — Interval H&P Note (Addendum)
 History and Physical Interval Note:  12/24/2023 7:03 AM  Alexander Deleon  has presented today for surgery, with the diagnosis of Prostate Cancer.  The various methods of treatment have been discussed with the patient and family. After consideration of risks, benefits and other options for treatment, the patient has consented to  Procedure(s): PROSTATECTOMY, RADICAL, ROBOT-ASSISTED, LAPAROSCOPIC (N/A) LYMPHADENECTOMY, PELVIS, ROBOT-ASSISTED (Bilateral) as a surgical intervention.  The patient's history has been reviewed, patient examined, no change in status, stable for surgery.  I have reviewed the patient's chart and labs.  Questions were answered to the patient's satisfaction.    UROLOGY H&P UPDATE  I evaluated the patient in pre-op holding. The prior H&P dated 12/05/23 was reviewed and no interval clinical changes are noted.   Cardiac: RRR Lungs: CTA bilaterally  Laterality: Bilateral  Procedure: RALP + BLPND  The procedure was reviewed and all questions were answered. Informed consent obtained and appropriate surgical marking completed.    [name], MD 12/24/2023   [name]

## 2023-12-24 NOTE — Plan of Care (Signed)
  Problem: Pain Management: Goal: General experience of comfort will improve Outcome: Progressing

## 2023-12-24 NOTE — Progress Notes (Signed)
 Post-op check   Patient in PACU4 Awake and conversational Minimal discomfort, some mild catheter irritation  Vitals wnl  Abd soft, incisions c/d/I  Catheter in place, secured to leg, light yellow urine in tubing   I spoke with his mother and niece earlier this morning and let them know surgery went very well He should sit to chair this evening and begin ambulation around he unit Clears tonight, regular diet tomorrow Expect discharge tomorrow AM

## 2023-12-24 NOTE — Anesthesia Procedure Notes (Addendum)
 Procedure Name: Intubation Date/Time: 12/24/2023 7:48 AM  Performed by: Bonnetta Jimmey SAUNDERS, CRNAPre-anesthesia Checklist: Patient identified, Emergency Drugs available, Suction available and Patient being monitored Patient Re-evaluated:Patient Re-evaluated prior to induction Oxygen Delivery Method: Circle system utilized Preoxygenation: Pre-oxygenation with 100% oxygen Induction Type: IV induction Ventilation: Mask ventilation without difficulty Laryngoscope Size: 4 Grade View: Grade I Tube type: Oral Tube size: 7.0 mm Number of attempts: 1 Airway Equipment and Method: Stylet and Oral airway Placement Confirmation: ETT inserted through vocal cords under direct vision, positive ETCO2 and breath sounds checked- equal and bilateral Secured at: 22 cm Tube secured with: Tape Dental Injury: Teeth and Oropharynx as per pre-operative assessment

## 2023-12-24 NOTE — Transfer of Care (Signed)
 Immediate Anesthesia Transfer of Care Note  Patient: Alexander Deleon  Procedure(s) Performed: PROSTATECTOMY, RADICAL, ROBOT-ASSISTED, LAPAROSCOPIC LYMPHADENECTOMY, PELVIS, ROBOT-ASSISTED (Bilateral)  Patient Location: PACU  Anesthesia Type:General  Level of Consciousness: drowsy  Airway & Oxygen Therapy: Patient Spontanous Breathing  Post-op Assessment: Report given to RN and Post -op Vital signs reviewed and stable  Post vital signs: Reviewed and stable  Last Vitals:  Vitals Value Taken Time  BP    Temp    Pulse 97 12/24/23 11:38  Resp 15 12/24/23 11:38  SpO2 95 % 12/24/23 11:38  Vitals shown include unfiled device data.  Last Pain:  Vitals:   12/24/23 0614  TempSrc: Temporal  PainSc: 0-No pain         Complications: There were no known notable events for this encounter.

## 2023-12-24 NOTE — Op Note (Addendum)
 Date of procedure: 12/24/23  Preoperative diagnosis:  Prostate cancer   Postoperative diagnosis:  Prostate Cancer   Procedure: Robotic radical prostatectomy  Bilateral pelvic lymph node dissection Robotic urethropexy   Surgeon: Penne Skye, MD  Assisting: Redell Burnet, MD - required for beside laparoscopic assisting, tissue manipulation, suction irrigation, and robotic instrument handling, in the absence of any other qualified personnel   Anesthesia: General  Complications: None  Intraoperative findings:  Partial nerve sparing on the Right, full nerve spare on the Left Watertight anastomosis at 150cc leak test, no drain  EBL: 50  Specimens:  Periprostatic lymph nodes Right pelvic lymph nodes Left pelvic lymph nodes Prostate, seminal vesicals, and proximal vas deferens  Drains:  67F 2-way indwelling Foley catheter with 20cc in balloon  Indication: Alexander Deleon is a 58 y.o. patient with: Unfavorable intermediate risk prostate Ca (dx Dec 2024)  PSA 14, 23g gland MRI/CT/BS - localized disease, no nodes/mets   After reviewing the management options for treatment, they elected to proceed with the above surgical procedure(s). We have discussed the potential benefits and risks of the procedure, side effects of the proposed treatment, the likelihood of the patient achieving the goals of the procedure, and any potential problems that might occur during the procedure or recuperation. Informed consent has been obtained.  Description of procedure:  Following induction, the patient was positioned in the supine position, taking care to pad all pressure points. Pre-operative 5000U subQ heparin was administered along with 2g Ancef. A 3 pt identifying pre-incision surgical timeout was performed. A sterile Foley catheter was placed on the field. Next, a 1 cm supraumbilical incision was made, followed by Veress needle entry into the abdomen. Pneumoperitoneum of 15 mm Hg was achieved  without issue. Next, we introduced an 8 mm robotic port. The robotic camera was inserted the abdomen was found to be free of injury. There were some mild left lower colonic side wall adhesions. The remainder of the robotic ports were placed under direct visualization along a straight line at the umbilical level. A 12 mm Left lateral assistant port and a 5 mm LUQ port were placed. The patient was placed in steep Trendelenberg and the robot was docked. We began by taking down the LLQ sigmoid attachments sharply. Next, we started our dissection by incising the posterior peritoneum. Bilateral vas deferens were identified and divided prior to isolating and dissecting each seminal vesicle. Next, we developed a plane along Denovillier's fascia towards the prostatic apex. We then moved anteriorly where the bladder was dropped in standard fashion by dividing bilateral medial ligaments. We continued our dissection through each paravesical fat plane until the endopelvics were encountered and opened sharply. The puboprostatics were released exposing the underlying dorsal venous complex. Next, we incised our bladder neck. There was no median prostate lobe. The posterior plane was opened and we delivered our bilateral seminal vesicles and proximal vas deferens. A bilateral nerve spare was peformed (partial nerve spare on Right, full nerve spare on Left) by sharply dissecting the lateral prostatic fascia and releasing this inferolaterally. Bilateral prostate pedicles were taken with Hemolok clips. We completed our posterior plane, taking care to preserve our neurovascular bundles inferolaterally. Next, we came across the DVC with a pneumoperitoneum of 20mm Hg. The apex of the prostate was fully delineated and the urethral contours were exposed. The urethra was divided sharply. We oversewed the DVC with a running 3-0 Stratafix. The remaining suture was then used to perform a urethropexy to the overlying pubic symphysis.  This was  cinched tight to elevated the urethro-DVC complex. Hemostasis was excellent and the pneumoperitoneum was returned to 15. At this point the prostate specimen was bagged and the pelvis irrigated. Next, we performed a bilateral pelvic lymph node dissection. Care was taken bilaterally to identify and preserve the obturator nerves which were clearly visualized. Lymph node packets were sent as separate final specimen. Next, we began our anastomosis. This was completed using a double armed 3-0 Stratafix. Prior to final closure we easily passed our final 19F 2-way catheter under direct visualization. The anastomosis was tied and the bladder irrigated with 150cc of sterile water without leak. Surgiflo was placed along each obturator fossa. Hemostasis was excellent. The robot instruments were removed and the robot undocked. The prostate specimen was extracted through the midline incision by gently incising the fascia. The fascia was closed with two opposing 0-Vicryl stitches tied in the middle. The 12 mm assistant port was closed with a 0-vicryl on UR6 in a figure of 8 type fashion. Port incisions were closed with 4-0 monocryl subcuticular stitches, followed by Dermabond. Catheter was draining clear yellow urine. The patient was awoken from anesthesia sent to the PACU in excellent condition.  Disposition: Stable to PACU  Plan: - Overnight observation - Anticipate discharge tomorrow AM - Follow up in 7-10 days for catheter removal in clinic  Penne Skye, MD

## 2023-12-24 NOTE — Anesthesia Preprocedure Evaluation (Signed)
 Anesthesia Evaluation  Patient identified by MRN, date of birth, ID band Patient awake    Reviewed: Allergy & Precautions, NPO status , Patient's Chart, lab work & pertinent test results  History of Anesthesia Complications Negative for: history of anesthetic complications  Airway Mallampati: I  TM Distance: >3 FB Neck ROM: full    Dental no notable dental hx.    Pulmonary neg pulmonary ROS   Pulmonary exam normal        Cardiovascular hypertension, On Medications negative cardio ROS Normal cardiovascular exam     Neuro/Psych  Neuromuscular disease  negative psych ROS   GI/Hepatic negative GI ROS, Neg liver ROS,,,  Endo/Other  negative endocrine ROSdiabetes, Type 2, Oral Hypoglycemic Agents    Renal/GU      Musculoskeletal   Abdominal   Peds  Hematology negative hematology ROS (+)   Anesthesia Other Findings Past Medical History: No date: Diabetes mellitus without complication (HCC) No date: Hypertension No date: Lumbar radiculopathy No date: Pneumonia 2024: Pulmonary sarcoidosis  Past Surgical History: 05/04/2019: COLONOSCOPY WITH PROPOFOL ; N/A     Comment:  Procedure: COLONOSCOPY WITH PROPOFOL ;  Surgeon: Therisa Bi, MD;  Location: [facility] ENDOSCOPY;  Service:               Gastroenterology;  Laterality: N/A; No date: KNEE SURGERY; Left  BMI    Body Mass Index: 26.31 kg/m      Reproductive/Obstetrics negative OB ROS                              Anesthesia Physical Anesthesia Plan  ASA: 2  Anesthesia Plan: General ETT   Post-op Pain Management: Toradol IV (intra-op)*, Ofirmev  IV (intra-op)*, Dilaudid IV and Ketamine IV*   Induction: Intravenous  PONV Risk Score and Plan: 2 and Ondansetron , Dexamethasone , Midazolam and Treatment may vary due to age or medical condition  Airway Management Planned: Oral ETT  Additional Equipment:   Intra-op Plan:    Post-operative Plan: Extubation in OR  Informed Consent: I have reviewed the patients History and Physical, chart, labs and discussed the procedure including the risks, benefits and alternatives for the proposed anesthesia with the patient or authorized representative who has indicated his/her understanding and acceptance.     Dental Advisory Given  Plan Discussed with: Anesthesiologist, CRNA and Surgeon  Anesthesia Plan Comments: (Patient consented for risks of anesthesia including but not limited to:  - adverse reactions to medications - damage to eyes, teeth, lips or other oral mucosa - nerve damage due to positioning  - sore throat or hoarseness - Damage to heart, brain, nerves, lungs, other parts of body or loss of life  Patient voiced understanding and assent.)        Anesthesia Quick Evaluation

## 2023-12-24 NOTE — Anesthesia Postprocedure Evaluation (Signed)
 Anesthesia Post Note  Patient: Alexander Deleon  Procedure(s) Performed: PROSTATECTOMY, RADICAL, ROBOT-ASSISTED, LAPAROSCOPIC LYMPHADENECTOMY, PELVIS, ROBOT-ASSISTED (Bilateral)  Patient location during evaluation: PACU Anesthesia Type: General Level of consciousness: awake and alert Pain management: pain level controlled Vital Signs Assessment: post-procedure vital signs reviewed and stable Respiratory status: spontaneous breathing, nonlabored ventilation, respiratory function stable and patient connected to nasal cannula oxygen Cardiovascular status: blood pressure returned to baseline and stable Postop Assessment: no apparent nausea or vomiting Anesthetic complications: no   There were no known notable events for this encounter.   Last Vitals:  Vitals:   12/24/23 1136 12/24/23 1145  BP: 101/80 124/75  Pulse: 97 95  Resp: 15 16  Temp: 36.9 C   SpO2: 93% 95%    Last Pain:  Vitals:   12/24/23 1205  TempSrc:   PainSc: 6                  Lendia LITTIE Mae

## 2023-12-25 ENCOUNTER — Encounter: Payer: Self-pay | Admitting: Urology

## 2023-12-25 DIAGNOSIS — C61 Malignant neoplasm of prostate: Secondary | ICD-10-CM | POA: Diagnosis not present

## 2023-12-25 LAB — CBC
HCT: 34.3 % — ABNORMAL LOW (ref 39.0–52.0)
Hemoglobin: 11.3 g/dL — ABNORMAL LOW (ref 13.0–17.0)
MCH: 27.2 pg (ref 26.0–34.0)
MCHC: 32.9 g/dL (ref 30.0–36.0)
MCV: 82.5 fL (ref 80.0–100.0)
Platelets: 174 K/uL (ref 150–400)
RBC: 4.16 MIL/uL — ABNORMAL LOW (ref 4.22–5.81)
RDW: 13.9 % (ref 11.5–15.5)
WBC: 8.1 K/uL (ref 4.0–10.5)
nRBC: 0 % (ref 0.0–0.2)

## 2023-12-25 LAB — BASIC METABOLIC PANEL WITH GFR
Anion gap: 7 (ref 5–15)
BUN: 20 mg/dL (ref 6–20)
CO2: 23 mmol/L (ref 22–32)
Calcium: 10.2 mg/dL (ref 8.9–10.3)
Chloride: 103 mmol/L (ref 98–111)
Creatinine, Ser: 1.25 mg/dL — ABNORMAL HIGH (ref 0.61–1.24)
GFR, Estimated: >60 mL/min (ref >60)
Glucose, Bld: 136 mg/dL — ABNORMAL HIGH (ref 70–99)
Potassium: 4.4 mmol/L (ref 3.5–5.1)
Sodium: 133 mmol/L — ABNORMAL LOW (ref 135–145)

## 2023-12-25 MED ORDER — OXYCODONE-ACETAMINOPHEN 5-325 MG PO TABS: 1.0000 | ORAL_TABLET | Freq: Four times a day (QID) | ORAL | 0 refills | Status: DC | PRN

## 2023-12-25 MED ORDER — LINAGLIPTIN 5 MG PO TABS
ORAL_TABLET | ORAL | Status: AC
  Filled 2023-12-25: qty 1

## 2023-12-25 MED ORDER — DOCUSATE SODIUM 100 MG PO CAPS: 100.0000 mg | ORAL_CAPSULE | Freq: Two times a day (BID) | ORAL | 0 refills | Status: AC

## 2023-12-25 MED ORDER — OXYBUTYNIN CHLORIDE ER 10 MG PO TB24: 10.0000 mg | ORAL_TABLET | Freq: Every day | ORAL | 0 refills | Status: DC

## 2023-12-25 NOTE — Progress Notes (Signed)
 DISCHARGE NOTE:  Pt given discharge instructions and verbalized understanding.  Foley catheter demonstration/education done, with all questions answered. Extra supplies given to pt, pt wheeled to car by staff, niece providing transportation home.

## 2023-12-25 NOTE — Plan of Care (Signed)
  Problem: Education: Goal: Knowledge of the procedure and recovery process will improve Outcome: Progressing   

## 2023-12-25 NOTE — Discharge Summary (Signed)
 Date of admission: 12/24/2023  Date of discharge: 12/25/2023  Admission diagnosis: Prostate cancer  Discharge diagnosis: Same as above  Secondary diagnoses:  Patient Active Problem List   Diagnosis Date Noted   Prostate cancer (HCC) 11/08/2023   Pneumonia due to COVID-19 virus 07/22/2019   Acute respiratory failure with hypoxia (HCC) 07/22/2019   Alcohol use 07/22/2019   History and Physical: For full details, please see admission history and physical. Briefly, Alexander Deleon is a 58 y.o. year old patient admitted on 12/24/2023 for scheduled robotic radical prostatectomy with bilateral pelvic lymph node dissection and robotic urethropexy with Dr. Georganne for management of prostate cancer.   A.m. labs with stable white count, 8.1, appropriate hemoglobin, 11.3, and stable creatinine, 1.25.  He is afebrile, VSS.  This morning he reports feeling rather well with adequate pain control.  He is ambulating on the unit without difficulty.  He has some occasional pelvic discomfort associated with the urge to have a bowel movement.  No nausea/vomiting.  He is hungry.  Foley catheter in place draining clear, yellow urine.  Diet was advanced in the morning. As of midday, he is tolerating PO without nausea/vomiting.  Physical Exam: Constitutional:  Alert and oriented, no acute distress, nontoxic appearing HEENT: [place], AT Cardiovascular: No clubbing, cyanosis, or edema Respiratory: Normal respiratory effort, no increased work of breathing GI: Abdomen is soft with appropriate postoperative tenderness, no rigidity or rebound. Incisions clean/dry/intact with overlying surgical adhesive. Skin: No rashes, bruises or suspicious lesions Neurologic: Grossly intact, no focal deficits, moving all 4 extremities Psychiatric: Normal mood and affect   Hospital Course: Patient tolerated the procedure well.  He was then transferred to the floor after an uneventful PACU stay.  His hospital course was uncomplicated.  On  POD#1 he had met discharge criteria: was eating a regular diet, was up and ambulating independently,  pain was well controlled, catheter was draining well, and was ready for discharge.  Laboratory values:  Recent Labs    12/24/23 1208 12/25/23 0520  WBC 7.2 8.1  HGB 12.2* 11.3*  HCT 36.8* 34.3*   Recent Labs    12/24/23 1208 12/25/23 0520  NA  --  133*  K  --  4.4  CL  --  103  CO2  --  23  GLUCOSE  --  136*  BUN  --  20  CREATININE 1.26* 1.25*  CALCIUM  --  10.2   Results for orders placed or performed during the hospital encounter of 12/19/23  Urine Culture     Status: None   Collection Time: 12/19/23  3:28 PM   Specimen: Urine, Clean Catch  Result Value Ref Range Status   Specimen Description   Final    URINE, CLEAN CATCH Performed at [facility], [address], [place], [place] [address]    Special Requests   Final    NONE Performed at [facility], [address] [place], [place] [address]    Culture   Final    NO GROWTH Performed at [facility], [address] [place], [place] [address]    Report Status 12/20/2023 FINAL  Final   Disposition: Home  Discharge instruction: The patient was instructed to be ambulatory but told to refrain from heavy lifting, strenuous activity, or driving.  Catheter care instructions provided by nursing.  Discharge medications:  Allergies as of 12/25/2023   No Known Allergies      Medication List     TAKE these medications  docusate sodium 100 MG capsule Commonly known as: Colace Take 1 capsule (100 mg total) by mouth 2 (two) times daily.   irbesartan  150 MG tablet Commonly known as: AVAPRO  Take 1 tablet (150 mg total) by mouth daily.   meclizine  25 MG tablet Commonly known as: ANTIVERT  Take 25 mg by mouth 3 (three) times daily as needed.   naphazoline-pheniramine 0.025-0.3 % ophthalmic solution Commonly known as: NAPHCON-A 1 drop 4 (four) times daily as needed for eye irritation  or allergies.   oxybutynin 10 MG 24 hr tablet Commonly known as: DITROPAN-XL Take 1 tablet (10 mg total) by mouth daily.   oxyCODONE-acetaminophen  5-325 MG tablet Commonly known as: Percocet Take 1 tablet by mouth every 6 (six) hours as needed for severe pain (pain score 7-10).   Zituvio  100 MG Tabs Generic drug: SITagliptin  Take 100 mg by mouth daily.        Followup:   Follow-up Information     Maurine Lukes, PA-C Follow up on 12/31/2023.   Specialty: Urology Why: For catheter removal Contact information: [address] [place] [address] [phone]

## 2023-12-25 NOTE — Plan of Care (Signed)
  Problem: Pain Management: Goal: General experience of comfort will improve Outcome: Progressing

## 2023-12-30 LAB — SURGICAL PATHOLOGY

## 2023-12-31 ENCOUNTER — Ambulatory Visit (INDEPENDENT_AMBULATORY_CARE_PROVIDER_SITE_OTHER): Admitting: Physician Assistant

## 2023-12-31 ENCOUNTER — Encounter: Payer: Self-pay | Admitting: Physician Assistant

## 2023-12-31 ENCOUNTER — Telehealth: Payer: Self-pay | Admitting: Physician Assistant

## 2023-12-31 VITALS — BP 119/77 | HR 123 | Ht 73.5 in | Wt 198.0 lb

## 2023-12-31 DIAGNOSIS — C61 Malignant neoplasm of prostate: Secondary | ICD-10-CM

## 2023-12-31 MED ORDER — TADALAFIL 5 MG PO TABS: 5.0000 mg | ORAL_TABLET | Freq: Every day | ORAL | 11 refills | Status: AC

## 2023-12-31 MED ORDER — SULFAMETHOXAZOLE-TRIMETHOPRIM 800-160 MG PO TABS
1.0000 | ORAL_TABLET | Freq: Once | ORAL | Status: AC
  Administered 2023-12-31: 1 via ORAL

## 2023-12-31 NOTE — Progress Notes (Signed)
 Catheter Removal  Patient is present today for a catheter removal.  18ml of water was drained from the balloon. A 20FR Silicone foley cath was removed from the bladder, no complications were noted. Patient tolerated well.  Performed by: Lucie Hones, PA-C   Additional notes: Patient denies LE edema, redness, or pain; chest pain; shortness of breath; palpitations. He is having BMs and continues to tolerate PO. Abdomen is soft without rigidity or rebound. Incisions are clean, dry, and intact.  Bactrim DS x1 dose administered prior to Foley removal for UTI prevention.  Surgical pathology results shared with patient. We discussed positive node and anticipate need for adjuvant ADT and/or radiation. We will move up his postop appointment with Dr. Georganne to discuss further.  We discussed anticipated postop urinary leakage. I specifically counseled him to anticipate leakage, wear absorbant products as needed for security, and that his first year postop will show the greatest improvement in leakage.   Follow up: Return for Will call to reschedule postop follow-up with Dr. Georganne.

## 2023-12-31 NOTE — Telephone Encounter (Signed)
 Please let him know that I spoke with Dr. Georganne after he left clinic today.  Dr. Georganne would like to start him on low-dose daily Cialis for penile rehabilitation after surgery.  I have sent a prescription in to [facility].  If insurance will not cover it or if there are any cost concerns with this, he can call us  back and we can send it into [facility] or [facility] as an alternative.

## 2023-12-31 NOTE — Telephone Encounter (Signed)
 Called patient and let him know that Dr. Georganne and Sam spoke and that he was prescribing low dose daily Cialis for penile rehab after surgery. He knows to call us  back if he has any issue with the cost of the drug.

## 2024-01-20 ENCOUNTER — Encounter: Admitting: Urology

## 2024-01-22 ENCOUNTER — Encounter: Admitting: Urology

## 2024-01-28 NOTE — Assessment & Plan Note (Signed)
 S/p RALP + BPLND (12/24/23)  - pT3aN1 GS 4+3=7, +EPE, +sm (at Right base/SV invasion)   Reviewed his surgical pathology-unfortunately this did show extraprostatic extension and a focal positive margin at this location.  Additionally he had a positive pelvic node.  I explained these findings within the context of his overall disease.  His prostate cancer remains very much treatable-although I would advocate for adjuvant versus early salvage radiation therapy.   - PSA in Jan 2026 (3 mo check) w/ return visit - continue daily tadalafil 5mg , penile rehab - continue daily pelvic floor strengthening, Kegel exercises

## 2024-01-28 NOTE — Progress Notes (Unsigned)
   01/30/2024 3:27 PM   Alexander Deleon 08-02-1965 [id number]  Reason for visit: Follow up prostate Ca   HPI: 58 y.o. male, follow up with me today  S/p RALP + BPLND (12/24/23)  - pT3aN1 GS 4+3=7, +EPE, +sm (at Right base/SV invasion)   5-week follow-up today.  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 Abd: Nondistended, nontender.  Port incisions and midline extraction incision all well-healed, no hernias  Laboratory Data: N/A  Pertinent Imaging: N/A    Assessment & Plan:    Prostate cancer [facility]) Assessment & Plan: S/p RALP + BPLND (12/24/23)  - pT3aN1 GS 4+3=7, +EPE, +sm (at Right base/SV invasion)   Reviewed his surgical pathology-unfortunately this did show extraprostatic extension and a focal positive margin at this location.  Additionally he had a positive pelvic node.  I explained these findings within the context of his overall disease.  His prostate cancer remains very much treatable-although I would advocate for adjuvant versus early salvage radiation therapy.   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  Perhaps a miscommunication, but he stopped taking his baseline Ditropan, and this may be contributing.   - Refill Ditropan 10 mg daily today, he needs to restart -Provided instructions on home pelvic floor therapy/Kegel exercises  Orders: -     oxyBUTYnin Chloride ER; Take 1 tablet (10 mg total) by mouth daily.  Dispense: 30 tablet; Refill: 11  Erectile dysfunction after radical prostatectomy Assessment & Plan: Prescribed 5 mg daily Cialis (penile rehab)   - Insurance denied  - Will resend prior authorization/quantity override for daily 5 mg Cialis         Makiya Jeune R Camerin Ladouceur, MD  [facility] [address], [address] [place], [place] [address] [phone]

## 2024-01-30 ENCOUNTER — Ambulatory Visit (INDEPENDENT_AMBULATORY_CARE_PROVIDER_SITE_OTHER): Admitting: Urology

## 2024-01-30 VITALS — BP 132/87 | HR 121 | Ht 73.0 in | Wt 201.0 lb

## 2024-01-30 DIAGNOSIS — C61 Malignant neoplasm of prostate: Secondary | ICD-10-CM

## 2024-01-30 DIAGNOSIS — N5231 Erectile dysfunction following radical prostatectomy: Secondary | ICD-10-CM

## 2024-01-30 DIAGNOSIS — R32 Unspecified urinary incontinence: Secondary | ICD-10-CM | POA: Insufficient documentation

## 2024-01-30 DIAGNOSIS — N3946 Mixed incontinence: Secondary | ICD-10-CM | POA: Diagnosis not present

## 2024-01-30 DIAGNOSIS — N529 Male erectile dysfunction, unspecified: Secondary | ICD-10-CM | POA: Insufficient documentation

## 2024-01-30 MED ORDER — OXYBUTYNIN CHLORIDE ER 10 MG PO TB24: 10.0000 mg | ORAL_TABLET | Freq: Every day | ORAL | 11 refills | Status: DC

## 2024-01-30 MED ORDER — OXYBUTYNIN CHLORIDE ER 10 MG PO TB24: 10.0000 mg | ORAL_TABLET | Freq: Every day | ORAL | 0 refills | Status: DC

## 2024-01-30 NOTE — Assessment & Plan Note (Signed)
 Prescribed 5 mg daily Cialis (penile rehab)   - Insurance denied  - Will resend prior authorization/quantity override for daily 5 mg Cialis

## 2024-01-30 NOTE — Assessment & Plan Note (Signed)
 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  Perhaps a miscommunication, but he stopped taking his baseline Ditropan, and this may be contributing.   - Refill Ditropan 10 mg daily today, he needs to restart -Provided instructions on home pelvic floor therapy/Kegel exercises

## 2024-02-03 ENCOUNTER — Ambulatory Visit: Admitting: Urology

## 2024-02-11 ENCOUNTER — Telehealth: Payer: Self-pay

## 2024-02-11 NOTE — Telephone Encounter (Signed)
 PA quantity over-ride for tadalafil  Auth # [id number]  PA denied.  Appeal will be faxed from [employer]. [phone]

## 2024-02-18 ENCOUNTER — Telehealth: Payer: Self-pay | Admitting: Urology

## 2024-02-18 NOTE — Telephone Encounter (Signed)
 Called patient to see what was going on. Pt is describing sores on the shaft of his penis. He states they are painful but not raised or any type of drainage. Pt says ingeneral his penis hurts and wonders if this is related to have his prostate removed recently. Pt does wear a depends through out the day since having surgery. Pt declined sending a picture via mychart and requested to be seen. He was scheduled for a appt tomorrow. It was suggested that he could use some aquaphor on sores until he comes in as an alternative to Neosporin. Pt voiced understanding.

## 2024-02-18 NOTE — Telephone Encounter (Signed)
 Pt LMOM that he went back to work and it's not going well since his surgery on 10/7.

## 2024-02-19 ENCOUNTER — Ambulatory Visit: Admitting: Physician Assistant

## 2024-02-19 ENCOUNTER — Encounter: Payer: Self-pay | Admitting: Physician Assistant

## 2024-02-19 VITALS — BP 133/91 | HR 64 | Ht 73.0 in | Wt 208.0 lb

## 2024-02-19 DIAGNOSIS — N4889 Other specified disorders of penis: Secondary | ICD-10-CM

## 2024-02-19 NOTE — Patient Instructions (Signed)
 AQUAPHOR you can get this a the pharmacy

## 2024-02-20 NOTE — Progress Notes (Signed)
 02/19/2024 9:13 AM   Tanda ORN Jurline [birth year] [id number]  CC: Chief Complaint  Patient presents with   Follow-up   HPI: Alexander Deleon is a 58 y.o. male with PMH diabetes and prostate cancer s/p RALP with Dr. Georganne on 12/24/2023 who presents today for evaluation of a sore in his penis.   Today he reports ongoing urinary leakage, for which he is wearing absorbent products.  He noticed a sore on the dorsal aspect of his penile shaft that is not particularly uncomfortable.  He denies recent sexual contacts due to post-prostatectomy ED.  PMH: Past Medical History:  Diagnosis Date   Diabetes mellitus without complication (HCC)    Hypertension    Lumbar radiculopathy    Pneumonia    Pulmonary sarcoidosis 2024    Surgical History: Past Surgical History:  Procedure Laterality Date   COLONOSCOPY WITH PROPOFOL  N/A 05/04/2019   Procedure: COLONOSCOPY WITH PROPOFOL ;  Surgeon: Therisa Bi, MD;  Location: [facility] ENDOSCOPY;  Service: Gastroenterology;  Laterality: N/A;   KNEE SURGERY Left    ROBOT ASSISTED LAPAROSCOPIC RADICAL PROSTATECTOMY N/A 12/24/2023   Procedure: PROSTATECTOMY, RADICAL, ROBOT-ASSISTED, LAPAROSCOPIC;  Surgeon: Georganne Penne SAUNDERS, MD;  Location: [facility] ORS;  Service: Urology;  Laterality: N/A;    Home Medications:  Allergies as of 02/19/2024   No Known Allergies      Medication List        Accurate as of February 19, 2024 11:59 PM. If you have any questions, ask your nurse or doctor.          docusate sodium  100 MG capsule Commonly known as: Colace Take 1 capsule (100 mg total) by mouth 2 (two) times daily.   irbesartan  150 MG tablet Commonly known as: AVAPRO  Take 1 tablet (150 mg total) by mouth daily.   meclizine  25 MG tablet Commonly known as: ANTIVERT  Take 25 mg by mouth 3 (three) times daily as needed.   naphazoline-pheniramine 0.025-0.3 % ophthalmic solution Commonly known as: NAPHCON-A 1 drop 4 (four) times daily as needed for eye irritation  or allergies.   oxybutynin  10 MG 24 hr tablet Commonly known as: DITROPAN -XL Take 1 tablet (10 mg total) by mouth daily.   oxyCODONE -acetaminophen  5-325 MG tablet Commonly known as: Percocet Take 1 tablet by mouth every 6 (six) hours as needed for severe pain (pain score 7-10).   tadalafil  5 MG tablet Commonly known as: CIALIS  Take 1 tablet (5 mg total) by mouth daily.   Zituvio  100 MG Tabs Generic drug: SITagliptin  Take 100 mg by mouth daily.        Allergies:  No Known Allergies  Family History: Family History  Problem Relation Age of Onset   Hypertension Mother    Hypertension Father     Social History:   reports that he has never smoked. He has never used smokeless tobacco. He reports current alcohol use of about 8.0 standard drinks of alcohol per week. He reports that he does not use drugs.  Physical Exam: BP (!) 133/91   Pulse 64   Ht 6' 1 (1.854 m)   Wt 208 lb (94.3 kg)   BMI 27.44 kg/m   Constitutional:  Alert and oriented, no acute distress, nontoxic appearing HEENT: [place], AT Cardiovascular: No clubbing, cyanosis, or edema Respiratory: Normal respiratory effort, no increased work of breathing GU: Small, <1cm linear skin break at the left dorsal lateral midshaft.  No edema, erythema, or purulence.  Urinary incontinence. Skin: No rashes, bruises or suspicious lesions Neurologic: Grossly intact,  no focal deficits, moving all 4 extremities Psychiatric: Normal mood and affect  Assessment & Plan:   1. Sore of penis (Primary) Small, linear skin tear not consistent with infectious etiology.  I suspect skin breakdown due to urinary incontinence and possible friction from absorbent products.  We discussed using Aquaphor ointment to protect the skin and as a moisture barrier.  Return for Keep follow-up as scheduled.  Lucie Hones, PA-C  [facility] [place] [address], [address] [place], [place] [address] [phone]

## 2024-03-23 DIAGNOSIS — C61 Malignant neoplasm of prostate: Secondary | ICD-10-CM

## 2024-03-25 ENCOUNTER — Other Ambulatory Visit

## 2024-03-25 DIAGNOSIS — C61 Malignant neoplasm of prostate: Secondary | ICD-10-CM

## 2024-03-26 LAB — PSA: Prostate Specific Ag, Serum: 0.9 ng/mL (ref 0.0–4.0)

## 2024-03-30 NOTE — Assessment & Plan Note (Signed)
 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  Reviewed his 24-month PSA which is detectable--unexpected considering his positive regional lymph node,+ ECE/+sm.  Again, I emphasized that we we will still be able to achieve long-term, durable cancer control, although he will need to undergo adjuvant radiotherapy.  I will go ahead and refer him to radiation oncology, although prefer to delay treatment until 8-12 mo postop for full functional recovery.   - Refer to Radiation oncology for adjuvant XRT - prefer to postpone treatment until ~July-Oct 2026 for functional recovery - continue daily tadalafil  5mg , penile rehab - continue daily pelvic floor strengthening, Kegel exercises

## 2024-03-30 NOTE — Progress Notes (Unsigned)
" ° °  04/02/2024 11:12 AM   Tanda ORN Jurline 1965/06/09 [id number]  Reason for visit: Follow up prostate Ca   HPI: 59 y.o. male, follow up with me today  S/p RALP + BPLND (12/24/23)  - pT3aN1 GS 4+3=7, +EPE, +sm (at Right base/SV invasion)   ~3 mo follow up today PSA 0.9 (03/25/24)  Prior HPI: Unfavorable intermediate risk prostate Ca (dx Dec 2024)  PSA 14, 23g gland MRI/CT/BS - localized disease, no nodes/mets   SHIM: 12 IPSS: 20/3   No hx of abdominal surgeries Lives at home with nephew, as good social support Works in [employer], [place]   Physical Exam: There were no vitals taken for this visit.   Constitutional:  Alert and oriented, No acute distress. Abd: Nondistended, nontender.  Port incisions and midline extraction incision all well-healed, no hernias  Laboratory Data: Component Ref Range & Units (hover) 5 d ago 1 yr ago  Prostate Specific Ag, Serum 0.9 14.1 High  CM    Pertinent Imaging: N/A    Assessment & Plan:    Prostate cancer [facility]) Assessment & Plan: S/p RALP + BPLND (12/24/23)  - pT3aN1 GS 4+3=7, +EPE, +sm (at Right base/SV invasion)   3-mo PSA (03/25/24) - 0.9  Overall continuing to do well.  Reviewed his 49-month PSA which is detectable--unexpected considering his positive regional lymph node,+ ECE/+sm.  Again, I emphasized that we we will still be able to achieve long-term, durable cancer control, although he will need to undergo adjuvant radiotherapy.  I will go ahead and refer him to radiation oncology, although prefer to delay treatment until 8-12 mo postop for full functional recovery.   - Refer to Radiation oncology for adjuvant XRT - prefer to postpone treatment until ~July-Oct 2026 for functional recovery - continue daily tadalafil  5mg , penile rehab - continue daily pelvic floor strengthening, Kegel exercises         [name], MD  [facility] [address] [place], [place] [address] [phone] "

## 2024-04-02 ENCOUNTER — Ambulatory Visit: Admitting: Urology

## 2024-04-02 DIAGNOSIS — C61 Malignant neoplasm of prostate: Secondary | ICD-10-CM

## 2024-04-15 ENCOUNTER — Ambulatory Visit
Admission: EM | Admit: 2024-04-15 | Discharge: 2024-04-15 | Disposition: A | Discharge status: Home / Self Care | Attending: Emergency Medicine | Admitting: Emergency Medicine

## 2024-04-15 ENCOUNTER — Ambulatory Visit

## 2024-04-15 DIAGNOSIS — J069 Acute upper respiratory infection, unspecified: Secondary | ICD-10-CM

## 2024-04-15 DIAGNOSIS — R051 Acute cough: Secondary | ICD-10-CM | POA: Diagnosis not present

## 2024-04-15 MED ORDER — IPRATROPIUM BROMIDE 0.06 % NA SOLN: 2.0000 | Freq: Four times a day (QID) | NASAL | 12 refills | Status: AC

## 2024-04-15 MED ORDER — BENZONATATE 100 MG PO CAPS: 200.0000 mg | ORAL_CAPSULE | Freq: Three times a day (TID) | ORAL | 0 refills | Status: AC

## 2024-04-15 MED ORDER — PROMETHAZINE-DM 6.25-15 MG/5ML PO SYRP: 5.0000 mL | ORAL_SOLUTION | Freq: Four times a day (QID) | ORAL | 0 refills | Status: AC | PRN

## 2024-04-15 MED ORDER — AMOXICILLIN-POT CLAVULANATE 875-125 MG PO TABS: 1.0000 | ORAL_TABLET | Freq: Two times a day (BID) | ORAL | 0 refills | Status: AC

## 2024-04-15 NOTE — ED Triage Notes (Signed)
 Pt c/o cough,chest congestion & runny nose x8 days. Was seen by PCP 2 days after sx's & tested neg for covid/flu. Has tried OTC meds w/o relief.

## 2024-04-15 NOTE — Discharge Instructions (Signed)
 Your chest x-ray did not show any evidence of pneumonia but it does show concern for worsening pulmonary sarcoidosis.  I do not see any indication that she had been evaluated or are currently followed by pulmonology in epic so I have made a referral to pulmonology.  They will reach out to you to schedule an appointment.  Take the Augmentin  875 mg twice daily with food for 7 days for treatment of your URI.  Use over-the-counter Tylenol  according to the package instructions as needed for any fever or pain.  Use the Atrovent  nasal spray, 2 squirts in each nostril every 6 hours, as needed for runny nose and postnasal drip.  Use the Tessalon  Perles every 8 hours during the day.  Take them with a small sip of water .  They may give you some numbness to the base of your tongue or a metallic taste in your mouth, this is normal.  Use the Promethazine  DM cough syrup at bedtime for cough and congestion.  It will make you drowsy so do not take it during the day.  Return for reevaluation or see your primary care provider for any new or worsening symptoms.  If you develop any shortness of breath, feel like you are having difficulty catching your breath, or you become unable to speak in full sentences you need to call 911 and go to the ER.

## 2024-04-15 NOTE — ED Provider Notes (Signed)
 " [facility]    CSN: [id number] Arrival date & time: 04/15/24  0820      History   Chief Complaint Chief Complaint  Patient presents with   Cough    HPI Alexander Deleon is a 59 y.o. male.   HPI  59 year old male with past medical history significant for pulmonary sarcoidosis, hypertension, diabetes, lumbar radiculopathy presents for evaluation of 8 days with respiratory symptoms that include runny nose, cough, chest congestion.  Seen by PCP on day 2 of symptoms and tested negative for COVID and flu.  Has been using over-the-counter medications without relief.  Past Medical History:  Diagnosis Date   Diabetes mellitus without complication (HCC)    Hypertension    Lumbar radiculopathy    Pneumonia    Pulmonary sarcoidosis 2024    Patient Active Problem List   Diagnosis Date Noted   Urinary leakage 01/30/2024   ED (erectile dysfunction) 01/30/2024   Prostate cancer (HCC) 11/08/2023   Arthritis of right hip 06/27/2022   Diabetes mellitus (HCC) 06/27/2022   Pain of right hip joint 06/27/2022   Spondylolisthesis of lumbar region 06/27/2022   Trochanteric bursitis of right hip 06/27/2022   Pneumonia due to COVID-19 virus 07/22/2019   Acute respiratory failure with hypoxia (HCC) 07/22/2019   Alcohol use 07/22/2019    Past Surgical History:  Procedure Laterality Date   COLONOSCOPY WITH PROPOFOL  N/A 05/04/2019   Procedure: COLONOSCOPY WITH PROPOFOL ;  Surgeon: Therisa Bi, MD;  Location: [facility] ENDOSCOPY;  Service: Gastroenterology;  Laterality: N/A;   KNEE SURGERY Left    ROBOT ASSISTED LAPAROSCOPIC RADICAL PROSTATECTOMY N/A 12/24/2023   Procedure: PROSTATECTOMY, RADICAL, ROBOT-ASSISTED, LAPAROSCOPIC;  Surgeon: Georganne Penne SAUNDERS, MD;  Location: [facility] ORS;  Service: Urology;  Laterality: N/A;       Home Medications    Prior to Admission medications  Medication Sig Start Date End Date Taking? Authorizing Provider  amoxicillin -clavulanate (AUGMENTIN ) 875-125 MG  tablet Take 1 tablet by mouth every 12 (twelve) hours for 7 days. 04/15/24 04/22/24 Yes Bernardino Ditch, NP  benzonatate  (TESSALON ) 100 MG capsule Take 2 capsules (200 mg total) by mouth every 8 (eight) hours. 04/15/24  Yes Bernardino Ditch, NP  ipratropium (ATROVENT ) 0.06 % nasal spray Place 2 sprays into both nostrils 4 (four) times daily. 04/15/24  Yes Bernardino Ditch, NP  promethazine -dextromethorphan  (PROMETHAZINE -DM) 6.25-15 MG/5ML syrup Take 5 mLs by mouth 4 (four) times daily as needed. 04/15/24  Yes Bernardino Ditch, NP  docusate sodium  (COLACE) 100 MG capsule Take 1 capsule (100 mg total) by mouth 2 (two) times daily. 12/25/23   Vaillancourt, Samantha, PA-C  irbesartan  (AVAPRO ) 150 MG tablet Take 1 tablet (150 mg total) by mouth daily. 02/14/21   Bernardino Ditch, NP  meclizine  (ANTIVERT ) 25 MG tablet Take 25 mg by mouth 3 (three) times daily as needed. 07/16/23   [provider]  naphazoline-pheniramine (NAPHCON-A) 0.025-0.3 % ophthalmic solution 1 drop 4 (four) times daily as needed for eye irritation or allergies.    [provider]  oxybutynin  (DITROPAN -XL) 10 MG 24 hr tablet Take 1 tablet (10 mg total) by mouth daily. Patient not taking: Reported on 02/19/2024 01/30/24   Georganne Penne SAUNDERS, MD  oxyCODONE -acetaminophen  (PERCOCET) 5-325 MG tablet Take 1 tablet by mouth every 6 (six) hours as needed for severe pain (pain score 7-10). 12/25/23   Vaillancourt, Samantha, PA-C  SITagliptin  (ZITUVIO ) 100 MG TABS Take 100 mg by mouth daily.    [provider]  tadalafil  (CIALIS ) 5 MG tablet Take  1 tablet (5 mg total) by mouth daily. 12/31/23   Vaillancourt, Samantha, PA-C    Family History Family History  Problem Relation Age of Onset   Hypertension Mother    Hypertension Father     Social History Social History[1]   Allergies   Patient has no known allergies.   Review of Systems Review of Systems  Constitutional:  Positive for chills. Negative for fever.  HENT:  Positive for  congestion and rhinorrhea. Negative for ear pain.        Yellow blood-tinged nasal discharge.  Respiratory:  Positive for cough. Negative for shortness of breath and wheezing.   Cardiovascular:  Negative for chest pain.  Gastrointestinal: Negative.   Neurological:  Negative for headaches.     Physical Exam Triage Vital Signs ED Triage Vitals  Encounter Vitals Group     BP      Girls Systolic BP Percentile      Girls Diastolic BP Percentile      Boys Systolic BP Percentile      Boys Diastolic BP Percentile      Pulse      Resp      Temp      Temp src      SpO2      Weight      Height      Head Circumference      Peak Flow      Pain Score      Pain Loc      Pain Education      Exclude from Growth Chart    No data found.  Updated Vital Signs BP 123/83 (BP Location: Left Arm)   Pulse (!) 110   Temp 98.4 F (36.9 C) (Oral)   Resp 18   Wt 203 lb 12.8 oz (92.4 kg)   SpO2 97%   BMI 26.89 kg/m   Visual Acuity Right Eye Distance:   Left Eye Distance:   Bilateral Distance:    Right Eye Near:   Left Eye Near:    Bilateral Near:     Physical Exam Vitals and nursing note reviewed.  Constitutional:      Appearance: Normal appearance. He is not ill-appearing.  HENT:     Head: Normocephalic and atraumatic.     Right Ear: Tympanic membrane, ear canal and external ear normal. There is no impacted cerumen.     Left Ear: Tympanic membrane, ear canal and external ear normal. There is no impacted cerumen.     Nose: Nose normal.     Mouth/Throat:     Mouth: Mucous membranes are moist.     Pharynx: Oropharynx is clear. Posterior oropharyngeal erythema present. No oropharyngeal exudate.     Comments: Erythema to the posterior pharynx with clear postnasal drip. Neck:     Comments: Bilateral anterior cervical lymphadenopathy that is nontender. Cardiovascular:     Rate and Rhythm: Regular rhythm. Tachycardia present.     Pulses: Normal pulses.     Heart sounds: Normal heart  sounds. No murmur heard.    No friction rub. No gallop.  Pulmonary:     Effort: Pulmonary effort is normal.     Breath sounds: No wheezing, rhonchi or rales.     Comments: Sounds in bilateral bases. Musculoskeletal:     Cervical back: Normal range of motion and neck supple.  Lymphadenopathy:     Cervical: Cervical adenopathy present.  Skin:    General: Skin is warm and dry.     Capillary  Refill: Capillary refill takes less than 2 seconds.     Findings: No rash.  Neurological:     General: No focal deficit present.     Mental Status: He is alert and oriented to person, place, and time.      UC Treatments / Results  Labs (all labs ordered are listed, but only abnormal results are displayed) Labs Reviewed - No data to display  EKG   Radiology DG Chest 2 View Result Date: 04/15/2024 CLINICAL DATA:  Nonproductive cough, history of pulmonary sarcoidosis EXAM: CHEST - 2 VIEW COMPARISON:  Prior chest x-ray 02/15/2021 FINDINGS: Peripheral reticulonodular opacities in both lung apices, along the lateral aspects of the lungs and at the lower lobes have slightly increased compared to prior imaging from 2022. Additionally, there is increased coarseness of the interstitial markings. Cardiac and mediastinal contours remain unchanged. No focal airspace infiltrate, pleural effusion or pneumothorax. No acute osseous abnormality. IMPRESSION: Interval progression of interstitial coarsening and scattered peripheral reticulonodular airspace opacities compared to the prior chest x-ray from November of 2022. Given the clinical history, findings are favored to reflect a progression of underlying pulmonary sarcoidosis. Consider further evaluation with noncontrast enhanced chest CT to confirm. Electronically Signed   By: Wilkie Lent M.D.   On: 04/15/2024 09:27    Procedures Procedures (including critical care time)  Medications Ordered in UC Medications - No data to display  Initial Impression /  Assessment and Plan / UC Course  I have reviewed the triage vital signs and the nursing notes.  Pertinent labs & imaging results that were available during my care of the patient were reviewed by me and considered in my medical decision making (see chart for details).   Patient is a pleasant, nontoxic-appearing 59 year old male presenting for evaluation 9 days of respiratory symptoms as outlined in HPI above.  He is not in any acute respiratory distress and he is able to speak in full sentence without dyspnea or tachypnea.  He does have mildly coarse lung sounds in bilateral bases.  Due to the fact that he has got a history of pulmonary sarcoidosis I will order a chest x-ray.  He is on day 9 of symptoms and he has tested negative for COVID or influenza at his PCPs office.  I do not feel need to retest at this time.  Chest x-ray independently reviewed and evaluated by me.  Impression: There is a slight blunting of the left costophrenic angle as well as patchy haziness throughout lower lung fields bilaterally.  On the lateral projection there is.  Fluid level in the anterior aspect of the left lower lobe with a well-circumscribed calcified lesion.  When compared to chest radiograph from 02/15/2021 this is an interval change.  Radiology read is pending. Radiology impression states interval progression of interstitial coarsening and scattered peripheral reticular nodular airspace opacities.  Suspect progression of underlying pulmonary sarcoidosis.  I will discharge patient on the diagnosis of URI with cough and congestion and prescribed a 7-day course of Augmentin  875.  Also prescribed Tessalon  Perles and Promethazine  DM cough syrup for cough and congestion.  For his runny nose I will also prescribe Atrovent  nasal spray.  I will refer him to pulmonology and also have staff establish a PCP at discharge to help him with his medications going forward.  Work note provided.   Final Clinical Impressions(s) / UC  Diagnoses   Final diagnoses:  Acute cough  URI with cough and congestion     Discharge Instructions  Your chest x-ray did not show any evidence of pneumonia but it does show concern for worsening pulmonary sarcoidosis.  I do not see any indication that she had been evaluated or are currently followed by pulmonology in epic so I have made a referral to pulmonology.  They will reach out to you to schedule an appointment.  Take the Augmentin  875 mg twice daily with food for 7 days for treatment of your URI.  Use over-the-counter Tylenol  according to the package instructions as needed for any fever or pain.  Use the Atrovent  nasal spray, 2 squirts in each nostril every 6 hours, as needed for runny nose and postnasal drip.  Use the Tessalon  Perles every 8 hours during the day.  Take them with a small sip of water .  They may give you some numbness to the base of your tongue or a metallic taste in your mouth, this is normal.  Use the Promethazine  DM cough syrup at bedtime for cough and congestion.  It will make you drowsy so do not take it during the day.  Return for reevaluation or see your primary care provider for any new or worsening symptoms.  If you develop any shortness of breath, feel like you are having difficulty catching your breath, or you become unable to speak in full sentences you need to call 911 and go to the ER.     ED Prescriptions     Medication Sig Dispense Auth. Provider   amoxicillin -clavulanate (AUGMENTIN ) 875-125 MG tablet Take 1 tablet by mouth every 12 (twelve) hours for 7 days. 14 tablet Bernardino Ditch, NP   benzonatate  (TESSALON ) 100 MG capsule Take 2 capsules (200 mg total) by mouth every 8 (eight) hours. 21 capsule Bernardino Ditch, NP   ipratropium (ATROVENT ) 0.06 % nasal spray Place 2 sprays into both nostrils 4 (four) times daily. 15 mL Bernardino Ditch, NP   promethazine -dextromethorphan  (PROMETHAZINE -DM) 6.25-15 MG/5ML syrup Take 5 mLs by mouth 4 (four)  times daily as needed. 118 mL Bernardino Ditch, NP      PDMP not reviewed this encounter.     [1]  Social History Tobacco Use   Smoking status: Never   Smokeless tobacco: Never  Vaping Use   Vaping status: Never Used  Substance Use Topics   Alcohol use: Yes    Alcohol/week: 8.0 standard drinks of alcohol    Types: 3 Cans of beer, 5 Shots of liquor per week    Comment: weekends   Drug use: Never     Bernardino Ditch, NP 04/15/24 [phone]  "

## 2024-04-21 ENCOUNTER — Encounter: Payer: Self-pay | Admitting: Pulmonary Disease

## 2024-04-21 ENCOUNTER — Ambulatory Visit: Admitting: Pulmonary Disease

## 2024-04-21 VITALS — BP 108/78 | HR 116 | Temp 97.6°F | Ht 73.0 in | Wt 207.0 lb

## 2024-04-21 DIAGNOSIS — Z8616 Personal history of COVID-19: Secondary | ICD-10-CM

## 2024-04-21 DIAGNOSIS — R000 Tachycardia, unspecified: Secondary | ICD-10-CM

## 2024-04-21 DIAGNOSIS — R0600 Dyspnea, unspecified: Secondary | ICD-10-CM

## 2024-04-21 DIAGNOSIS — R918 Other nonspecific abnormal finding of lung field: Secondary | ICD-10-CM

## 2024-04-21 DIAGNOSIS — D869 Sarcoidosis, unspecified: Secondary | ICD-10-CM

## 2024-04-21 DIAGNOSIS — R002 Palpitations: Secondary | ICD-10-CM

## 2024-04-21 NOTE — Patient Instructions (Addendum)
 VISIT SUMMARY:  During your visit, we discussed the abnormal chest x-ray findings and the possibility of sarcoidosis. We also addressed your concerns about frequent episodes of your heart 'beating fast'.  YOUR PLAN:  -SARCOIDOSIS: Sarcoidosis is a condition where inflammatory cells grow in different parts of your body, often affecting the lungs. We need to conduct further tests to confirm this diagnosis. We have ordered a chest CT scan, blood work, and pulmonary function tests to get a detailed evaluation of your lung function and to look for markers of sarcoidosis. We will coordinate with your primary care physician for follow-up and report sharing.  - TACHYCARDIA: Tachycardia is having a fast-beating, fluttering, or pounding heart. Given your history and the potential link to sarcoidosis, we need to investigate further. We have ordered an echocardiogram to assess your heart function and rule out any cardiac involvement. We will review the results and discuss further management during your follow-up appointment in 4-6 weeks.  INSTRUCTIONS:  Please complete the chest CT scan, blood work, pulmonary function tests, and echocardiogram as scheduled. We will review the results and discuss the next steps during your follow-up appointment in 4-6 weeks.

## 2024-04-21 NOTE — Assessment & Plan Note (Signed)
 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  Reviewed his 76-month PSA which is detectable--unexpected considering his positive regional lymph node,+ ECE/+sm.  Again, I emphasized that we we will still be able to achieve long-term, durable cancer control, although he will need to undergo adjuvant radiotherapy.  I will go ahead and refer him to radiation oncology, although prefer to delay treatment until 8-12 mo postop for full functional recovery.   - Refer to Radiation oncology for adjuvant XRT - prefer to postpone treatment until ~July-Oct 2026 for functional recovery - continue daily tadalafil  5mg , penile rehab - continue daily pelvic floor strengthening, Kegel exercises

## 2024-04-23 ENCOUNTER — Ambulatory Visit: Admitting: Urology

## 2024-04-23 VITALS — BP 138/91 | HR 103 | Ht 73.0 in | Wt 208.0 lb

## 2024-04-23 DIAGNOSIS — N3946 Mixed incontinence: Secondary | ICD-10-CM

## 2024-04-23 DIAGNOSIS — C61 Malignant neoplasm of prostate: Secondary | ICD-10-CM | POA: Diagnosis not present

## 2024-04-23 MED ORDER — OXYBUTYNIN CHLORIDE ER 10 MG PO TB24: 10.0000 mg | ORAL_TABLET | Freq: Every day | ORAL | 6 refills | Status: AC

## 2024-04-23 NOTE — Assessment & Plan Note (Signed)
 Post RP urinary incontinence  -SUI predominant, with some degree of UUI  - Will restart Ditropan  10 mg XL -Formal referral for pelvic floor physical therapy -Will closely reassess every 3 month.  Reviewed the role of additional surgeries long-term.  This would need to wait until full oncologic /adjuvant radiation treatment

## 2024-04-28 ENCOUNTER — Ambulatory Visit

## 2024-05-07 ENCOUNTER — Ambulatory Visit: Admitting: Radiation Oncology

## 2024-05-19 ENCOUNTER — Ambulatory Visit

## 2024-06-11 ENCOUNTER — Ambulatory Visit: Admitting: Pulmonary Disease

## 2024-06-11 ENCOUNTER — Encounter

## 2024-07-21 ENCOUNTER — Other Ambulatory Visit

## 2024-07-24 ENCOUNTER — Ambulatory Visit: Admitting: Urology
